# Patient Record
Sex: Female | Born: 1935 | Race: White | Hispanic: No
Health system: Southern US, Community
[De-identification: ages and names within clinical notes are randomized; demographics above are authoritative.]

---

## 1998-06-24 ENCOUNTER — Encounter: Payer: Self-pay | Admitting: Emergency Medicine

## 1998-06-24 ENCOUNTER — Observation Stay (HOSPITAL_COMMUNITY): Admission: EM | Admit: 1998-06-24 | Discharge: 1998-06-25 | Payer: Self-pay | Admitting: Emergency Medicine

## 1998-09-03 ENCOUNTER — Ambulatory Visit (HOSPITAL_COMMUNITY): Admission: RE | Admit: 1998-09-03 | Discharge: 1998-09-03 | Payer: Self-pay

## 2004-05-18 ENCOUNTER — Ambulatory Visit: Payer: Self-pay

## 2004-07-06 ENCOUNTER — Ambulatory Visit: Payer: Self-pay | Admitting: Ophthalmology

## 2004-07-13 ENCOUNTER — Ambulatory Visit: Payer: Self-pay | Admitting: Ophthalmology

## 2005-04-09 ENCOUNTER — Ambulatory Visit: Payer: Self-pay

## 2007-03-12 ENCOUNTER — Emergency Department: Payer: Self-pay | Admitting: Emergency Medicine

## 2007-03-12 ENCOUNTER — Other Ambulatory Visit: Payer: Self-pay

## 2007-08-17 ENCOUNTER — Ambulatory Visit: Payer: Self-pay

## 2008-03-01 ENCOUNTER — Inpatient Hospital Stay: Payer: Self-pay | Admitting: Specialist

## 2008-09-12 ENCOUNTER — Emergency Department: Payer: Self-pay | Admitting: Emergency Medicine

## 2009-02-10 ENCOUNTER — Inpatient Hospital Stay: Payer: Self-pay | Admitting: Internal Medicine

## 2009-07-14 ENCOUNTER — Ambulatory Visit: Payer: Self-pay

## 2011-06-07 ENCOUNTER — Emergency Department: Payer: Self-pay | Admitting: Emergency Medicine

## 2011-06-07 LAB — CBC
HGB: 11.2 g/dL — ABNORMAL LOW (ref 12.0–16.0)
MCHC: 33 g/dL (ref 32.0–36.0)
MCV: 98 fL (ref 80–100)
RBC: 3.48 10*6/uL — ABNORMAL LOW (ref 3.80–5.20)
RDW: 13.8 % (ref 11.5–14.5)
WBC: 6.2 10*3/uL (ref 3.6–11.0)

## 2011-06-07 LAB — COMPREHENSIVE METABOLIC PANEL
Albumin: 3.5 g/dL (ref 3.4–5.0)
Alkaline Phosphatase: 62 U/L (ref 50–136)
Anion Gap: 6 — ABNORMAL LOW (ref 7–16)
Bilirubin,Total: 0.2 mg/dL (ref 0.2–1.0)
Chloride: 106 mmol/L (ref 98–107)
Co2: 31 mmol/L (ref 21–32)
EGFR (African American): 55 — ABNORMAL LOW
Glucose: 120 mg/dL — ABNORMAL HIGH (ref 65–99)
Osmolality: 293 (ref 275–301)
SGOT(AST): 15 U/L (ref 15–37)
SGPT (ALT): 14 U/L
Sodium: 143 mmol/L (ref 136–145)

## 2011-08-03 LAB — CBC
HCT: 34.3 % — ABNORMAL LOW (ref 35.0–47.0)
HGB: 11.3 g/dL — ABNORMAL LOW (ref 12.0–16.0)
MCH: 32.3 pg (ref 26.0–34.0)
MCHC: 32.9 g/dL (ref 32.0–36.0)
MCV: 98 fL (ref 80–100)
Platelet: 179 10*3/uL (ref 150–440)
RBC: 3.5 10*6/uL — ABNORMAL LOW (ref 3.80–5.20)
RDW: 15 % — ABNORMAL HIGH (ref 11.5–14.5)
WBC: 7.4 10*3/uL (ref 3.6–11.0)

## 2011-08-03 LAB — COMPREHENSIVE METABOLIC PANEL
Albumin: 2.9 g/dL — ABNORMAL LOW (ref 3.4–5.0)
Alkaline Phosphatase: 72 U/L (ref 50–136)
Anion Gap: 7 (ref 7–16)
BUN: 40 mg/dL — ABNORMAL HIGH (ref 7–18)
Bilirubin,Total: 0.3 mg/dL (ref 0.2–1.0)
Calcium, Total: 9 mg/dL (ref 8.5–10.1)
Chloride: 104 mmol/L (ref 98–107)
Co2: 32 mmol/L (ref 21–32)
Creatinine: 1.58 mg/dL — ABNORMAL HIGH (ref 0.60–1.30)
EGFR (African American): 34 — ABNORMAL LOW
EGFR (Non-African Amer.): 30 — ABNORMAL LOW
Glucose: 203 mg/dL — ABNORMAL HIGH (ref 65–99)
Osmolality: 301 (ref 275–301)
Potassium: 4.7 mmol/L (ref 3.5–5.1)
SGOT(AST): 19 U/L (ref 15–37)
SGPT (ALT): 16 U/L
Sodium: 143 mmol/L (ref 136–145)
Total Protein: 7.5 g/dL (ref 6.4–8.2)

## 2011-08-04 ENCOUNTER — Inpatient Hospital Stay: Payer: Self-pay | Admitting: Internal Medicine

## 2011-08-05 LAB — BASIC METABOLIC PANEL
Anion Gap: 5 — ABNORMAL LOW (ref 7–16)
BUN: 26 mg/dL — ABNORMAL HIGH (ref 7–18)
Calcium, Total: 8.3 mg/dL — ABNORMAL LOW (ref 8.5–10.1)
Chloride: 107 mmol/L (ref 98–107)
Co2: 32 mmol/L (ref 21–32)
Creatinine: 1.23 mg/dL (ref 0.60–1.30)
EGFR (African American): 47 — ABNORMAL LOW
EGFR (Non-African Amer.): 40 — ABNORMAL LOW
Glucose: 74 mg/dL (ref 65–99)
Osmolality: 290 (ref 275–301)
Potassium: 4.1 mmol/L (ref 3.5–5.1)
Sodium: 144 mmol/L (ref 136–145)

## 2011-08-08 LAB — WOUND CULTURE

## 2011-08-09 LAB — CULTURE, BLOOD (SINGLE)

## 2011-08-11 ENCOUNTER — Ambulatory Visit: Payer: Self-pay | Admitting: Vascular Surgery

## 2011-08-16 ENCOUNTER — Inpatient Hospital Stay: Payer: Self-pay | Admitting: Internal Medicine

## 2011-08-16 LAB — COMPREHENSIVE METABOLIC PANEL
Alkaline Phosphatase: 64 U/L (ref 50–136)
Anion Gap: 9 (ref 7–16)
Bilirubin,Total: 0.3 mg/dL (ref 0.2–1.0)
Calcium, Total: 8.4 mg/dL — ABNORMAL LOW (ref 8.5–10.1)
Co2: 31 mmol/L (ref 21–32)
Creatinine: 1.31 mg/dL — ABNORMAL HIGH (ref 0.60–1.30)
EGFR (African American): 43 — ABNORMAL LOW
EGFR (Non-African Amer.): 37 — ABNORMAL LOW
Glucose: 200 mg/dL — ABNORMAL HIGH (ref 65–99)
Potassium: 4.4 mmol/L (ref 3.5–5.1)
SGOT(AST): 15 U/L (ref 15–37)
SGPT (ALT): 13 U/L
Sodium: 145 mmol/L (ref 136–145)

## 2011-08-16 LAB — CBC WITH DIFFERENTIAL/PLATELET
Basophil #: 0 10*3/uL (ref 0.0–0.1)
Basophil %: 0.3 %
Eosinophil %: 0.7 %
Lymphocyte #: 1.7 10*3/uL (ref 1.0–3.6)
MCHC: 31.6 g/dL — ABNORMAL LOW (ref 32.0–36.0)
MCV: 99 fL (ref 80–100)
Monocyte %: 16.9 %
Neutrophil %: 60.2 %
Platelet: 196 10*3/uL (ref 150–440)
RBC: 3.22 10*6/uL — ABNORMAL LOW (ref 3.80–5.20)
WBC: 7.9 10*3/uL (ref 3.6–11.0)

## 2011-08-16 LAB — PROTIME-INR
INR: 1
Prothrombin Time: 13.1 secs (ref 11.5–14.7)

## 2011-08-16 LAB — CREATININE, SERUM
Creatinine: 1.57 mg/dL — ABNORMAL HIGH (ref 0.60–1.30)
EGFR (African American): 35 — ABNORMAL LOW
EGFR (Non-African Amer.): 30 — ABNORMAL LOW

## 2011-08-16 LAB — BUN: BUN: 37 mg/dL — ABNORMAL HIGH (ref 7–18)

## 2011-08-17 LAB — COMPREHENSIVE METABOLIC PANEL
Albumin: 2.2 g/dL — ABNORMAL LOW (ref 3.4–5.0)
Alkaline Phosphatase: 63 U/L (ref 50–136)
Bilirubin,Total: 0.2 mg/dL (ref 0.2–1.0)
Calcium, Total: 7.9 mg/dL — ABNORMAL LOW (ref 8.5–10.1)
Chloride: 106 mmol/L (ref 98–107)
Co2: 32 mmol/L (ref 21–32)
Creatinine: 1.29 mg/dL (ref 0.60–1.30)
EGFR (African American): 44 — ABNORMAL LOW
EGFR (Non-African Amer.): 38 — ABNORMAL LOW
SGPT (ALT): 11 U/L — ABNORMAL LOW

## 2011-08-17 LAB — CBC WITH DIFFERENTIAL/PLATELET
Basophil #: 0 10*3/uL (ref 0.0–0.1)
Basophil %: 0.3 %
Eosinophil #: 0 10*3/uL (ref 0.0–0.7)
Eosinophil %: 0.6 %
HCT: 28.8 % — ABNORMAL LOW (ref 35.0–47.0)
HGB: 9.2 g/dL — ABNORMAL LOW (ref 12.0–16.0)
Lymphocyte #: 2 10*3/uL (ref 1.0–3.6)
MCH: 31.4 pg (ref 26.0–34.0)
MCHC: 32.1 g/dL (ref 32.0–36.0)
MCV: 98 fL (ref 80–100)
Monocyte #: 1.2 x10 3/mm — ABNORMAL HIGH (ref 0.2–0.9)
Monocyte %: 17.3 %
Neutrophil #: 3.7 10*3/uL (ref 1.4–6.5)
Platelet: 187 10*3/uL (ref 150–440)
RBC: 2.94 10*6/uL — ABNORMAL LOW (ref 3.80–5.20)
RDW: 15.1 % — ABNORMAL HIGH (ref 11.5–14.5)
WBC: 7.1 10*3/uL (ref 3.6–11.0)

## 2011-08-17 LAB — CK TOTAL AND CKMB (NOT AT ARMC)
CK, Total: 46 U/L (ref 21–215)
CK-MB: 2.7 ng/mL (ref 0.5–3.6)

## 2011-08-17 LAB — LIPID PANEL
Cholesterol: 142 mg/dL (ref 0–200)
HDL Cholesterol: 47 mg/dL (ref 40–60)
Ldl Cholesterol, Calc: 76 mg/dL (ref 0–100)
Triglycerides: 93 mg/dL (ref 0–200)

## 2011-08-17 LAB — APTT
Activated PTT: 134.6 secs — ABNORMAL HIGH (ref 23.6–35.9)
Activated PTT: 135.1 secs — ABNORMAL HIGH (ref 23.6–35.9)

## 2011-08-17 LAB — DIGOXIN LEVEL: Digoxin: 1.6 ng/mL

## 2011-08-17 LAB — TROPONIN I
Troponin-I: 0.29 ng/mL — ABNORMAL HIGH
Troponin-I: 0.28 ng/mL — ABNORMAL HIGH

## 2011-08-17 LAB — HEMOGLOBIN A1C: Hemoglobin A1C: 6.3 % (ref 4.2–6.3)

## 2011-08-17 LAB — MAGNESIUM: Magnesium: 1.7 mg/dL — ABNORMAL LOW

## 2012-03-26 ENCOUNTER — Emergency Department: Payer: Self-pay | Admitting: Emergency Medicine

## 2012-03-26 LAB — LIPASE, BLOOD: Lipase: 71 U/L — ABNORMAL LOW (ref 73–393)

## 2012-03-26 LAB — COMPREHENSIVE METABOLIC PANEL
Albumin: 2.8 g/dL — ABNORMAL LOW (ref 3.4–5.0)
Alkaline Phosphatase: 323 U/L — ABNORMAL HIGH (ref 50–136)
Anion Gap: 4 — ABNORMAL LOW (ref 7–16)
BUN: 26 mg/dL — ABNORMAL HIGH (ref 7–18)
Calcium, Total: 8.8 mg/dL (ref 8.5–10.1)
Chloride: 105 mmol/L (ref 98–107)
Creatinine: 1.03 mg/dL (ref 0.60–1.30)
EGFR (African American): 57 — ABNORMAL LOW
EGFR (Non-African Amer.): 50 — ABNORMAL LOW
Glucose: 265 mg/dL — ABNORMAL HIGH (ref 65–99)
Osmolality: 293 (ref 275–301)
Potassium: 3.7 mmol/L (ref 3.5–5.1)
SGOT(AST): 55 U/L — ABNORMAL HIGH (ref 15–37)
Sodium: 140 mmol/L (ref 136–145)
Total Protein: 7 g/dL (ref 6.4–8.2)

## 2012-03-26 LAB — CBC WITH DIFFERENTIAL/PLATELET
Basophil #: 0 10*3/uL (ref 0.0–0.1)
Basophil %: 0.2 %
Lymphocyte #: 2 10*3/uL (ref 1.0–3.6)
MCH: 31.1 pg (ref 26.0–34.0)
MCHC: 32.8 g/dL (ref 32.0–36.0)
MCV: 95 fL (ref 80–100)
Monocyte #: 0.8 x10 3/mm (ref 0.2–0.9)
Monocyte %: 12.2 %
Neutrophil #: 3.7 10*3/uL (ref 1.4–6.5)
Neutrophil %: 56.6 %
RBC: 3.8 10*6/uL (ref 3.80–5.20)
WBC: 6.6 10*3/uL (ref 3.6–11.0)

## 2012-03-26 LAB — URINALYSIS, COMPLETE
Bacteria: NONE SEEN
Bilirubin,UR: NEGATIVE
Blood: NEGATIVE
Leukocyte Esterase: NEGATIVE
Ph: 5 (ref 4.5–8.0)
RBC,UR: 8 /HPF (ref 0–5)
Specific Gravity: 1.03 (ref 1.003–1.030)
WBC UR: 2 /HPF (ref 0–5)

## 2012-03-26 LAB — DIGOXIN LEVEL: Digoxin: 0.1 ng/mL

## 2012-07-20 ENCOUNTER — Emergency Department: Payer: Self-pay | Admitting: Unknown Physician Specialty

## 2012-07-20 LAB — COMPREHENSIVE METABOLIC PANEL
Albumin: 3.4 g/dL (ref 3.4–5.0)
Alkaline Phosphatase: 75 U/L (ref 50–136)
Anion Gap: 3 — ABNORMAL LOW (ref 7–16)
BUN: 12 mg/dL (ref 7–18)
Chloride: 107 mmol/L (ref 98–107)
Creatinine: 0.94 mg/dL (ref 0.60–1.30)
EGFR (African American): >60
EGFR (Non-African Amer.): 55 — ABNORMAL LOW
Glucose: 183 mg/dL — ABNORMAL HIGH (ref 65–99)
Potassium: 4 mmol/L (ref 3.5–5.1)
SGOT(AST): 18 U/L (ref 15–37)
Total Protein: 6.8 g/dL (ref 6.4–8.2)

## 2012-07-20 LAB — TROPONIN I: Troponin-I: <0.02 ng/mL

## 2012-07-20 LAB — CBC
HGB: 13.2 g/dL (ref 12.0–16.0)
Platelet: 151 10*3/uL (ref 150–440)
RBC: 4.19 10*6/uL (ref 3.80–5.20)
RDW: 14.9 % — ABNORMAL HIGH (ref 11.5–14.5)
WBC: 7.1 10*3/uL (ref 3.6–11.0)

## 2012-07-20 LAB — URINALYSIS, COMPLETE
Glucose,UR: NEGATIVE mg/dL (ref 0–75)
Leukocyte Esterase: NEGATIVE
Ph: 5 (ref 4.5–8.0)
Protein: NEGATIVE
RBC,UR: 90 /HPF (ref 0–5)
Specific Gravity: 1.021 (ref 1.003–1.030)
Squamous Epithelial: <1
WBC UR: 2 /HPF (ref 0–5)

## 2012-07-20 LAB — CK TOTAL AND CKMB (NOT AT ARMC): CK-MB: 0.5 ng/mL (ref 0.5–3.6)

## 2012-08-16 ENCOUNTER — Emergency Department: Payer: Self-pay | Admitting: Emergency Medicine

## 2012-08-16 LAB — COMPREHENSIVE METABOLIC PANEL
Albumin: 3.6 g/dL (ref 3.4–5.0)
Alkaline Phosphatase: 92 U/L (ref 50–136)
Anion Gap: 6 — ABNORMAL LOW (ref 7–16)
Calcium, Total: 9.3 mg/dL (ref 8.5–10.1)
Chloride: 101 mmol/L (ref 98–107)
EGFR (African American): >60
EGFR (Non-African Amer.): >60
Osmolality: 283 (ref 275–301)
SGPT (ALT): 13 U/L (ref 12–78)
Sodium: 139 mmol/L (ref 136–145)
Total Protein: 7.6 g/dL (ref 6.4–8.2)

## 2012-08-16 LAB — URINALYSIS, COMPLETE
Blood: NEGATIVE
Ph: 6 (ref 4.5–8.0)
RBC,UR: 7 /HPF (ref 0–5)
Squamous Epithelial: NONE SEEN
WBC UR: <1 /HPF (ref 0–5)

## 2012-08-16 LAB — CK TOTAL AND CKMB (NOT AT ARMC): CK, Total: 47 U/L (ref 21–215)

## 2012-08-16 LAB — TROPONIN I
Troponin-I: <0.02 ng/mL
Troponin-I: <0.02 ng/mL

## 2012-08-16 LAB — CBC
HCT: 39.9 % (ref 35.0–47.0)
MCH: 31.6 pg (ref 26.0–34.0)
MCHC: 33.5 g/dL (ref 32.0–36.0)
MCV: 95 fL (ref 80–100)
Platelet: 139 10*3/uL — ABNORMAL LOW (ref 150–440)
RBC: 4.22 10*6/uL (ref 3.80–5.20)
RDW: 14.5 % (ref 11.5–14.5)
WBC: 7.8 10*3/uL (ref 3.6–11.0)

## 2012-08-16 LAB — LIPASE, BLOOD: Lipase: 72 U/L — ABNORMAL LOW (ref 73–393)

## 2012-09-06 ENCOUNTER — Ambulatory Visit: Payer: Self-pay | Admitting: Surgery

## 2012-09-18 ENCOUNTER — Ambulatory Visit: Payer: Self-pay | Admitting: Surgery

## 2012-09-20 ENCOUNTER — Ambulatory Visit: Payer: Self-pay | Admitting: Surgery

## 2012-09-21 LAB — PATHOLOGY REPORT

## 2012-09-22 ENCOUNTER — Inpatient Hospital Stay: Payer: Self-pay | Admitting: Internal Medicine

## 2012-09-22 LAB — LIPASE, BLOOD: Lipase: 66 U/L — ABNORMAL LOW (ref 73–393)

## 2012-09-22 LAB — CBC WITH DIFFERENTIAL/PLATELET
Basophil #: 0 10*3/uL (ref 0.0–0.1)
Basophil %: 0.3 %
Eosinophil %: 0.1 %
HCT: 37.6 % (ref 35.0–47.0)
HGB: 12.4 g/dL (ref 12.0–16.0)
Lymphocyte #: 1.3 10*3/uL (ref 1.0–3.6)
Monocyte #: 1.4 x10 3/mm — ABNORMAL HIGH (ref 0.2–0.9)
Monocyte %: 12.4 %
Neutrophil #: 8.8 10*3/uL — ABNORMAL HIGH (ref 1.4–6.5)
Neutrophil %: 75.6 %

## 2012-09-22 LAB — URINALYSIS, COMPLETE
Bilirubin,UR: NEGATIVE
Glucose,UR: NEGATIVE mg/dL (ref 0–75)
Nitrite: NEGATIVE
Ph: 5 (ref 4.5–8.0)
Protein: 30
RBC,UR: <1 /HPF (ref 0–5)
Specific Gravity: 1.028 (ref 1.003–1.030)
Squamous Epithelial: NONE SEEN
WBC UR: 1 /HPF (ref 0–5)

## 2012-09-22 LAB — COMPREHENSIVE METABOLIC PANEL
Albumin: 2.5 g/dL — ABNORMAL LOW (ref 3.4–5.0)
Alkaline Phosphatase: 72 U/L (ref 50–136)
Bilirubin,Total: 0.8 mg/dL (ref 0.2–1.0)
Chloride: 104 mmol/L (ref 98–107)
Creatinine: 0.96 mg/dL (ref 0.60–1.30)
EGFR (African American): >60
EGFR (Non-African Amer.): 54 — ABNORMAL LOW
Glucose: 172 mg/dL — ABNORMAL HIGH (ref 65–99)
Osmolality: 289 (ref 275–301)
Potassium: 3.8 mmol/L (ref 3.5–5.1)
SGOT(AST): 19 U/L (ref 15–37)
Total Protein: 6.3 g/dL — ABNORMAL LOW (ref 6.4–8.2)

## 2012-09-22 LAB — TSH: Thyroid Stimulating Horm: 0.811 u[IU]/mL

## 2012-09-23 LAB — BASIC METABOLIC PANEL
Anion Gap: 5 — ABNORMAL LOW (ref 7–16)
BUN: 27 mg/dL — ABNORMAL HIGH (ref 7–18)
Calcium, Total: 8.9 mg/dL (ref 8.5–10.1)
Chloride: 106 mmol/L (ref 98–107)
Co2: 28 mmol/L (ref 21–32)
EGFR (African American): >60
EGFR (Non-African Amer.): >60
Osmolality: 289 (ref 275–301)
Potassium: 3.6 mmol/L (ref 3.5–5.1)
Sodium: 139 mmol/L (ref 136–145)

## 2012-09-23 LAB — CBC WITH DIFFERENTIAL/PLATELET
Basophil #: 0 10*3/uL (ref 0.0–0.1)
Basophil %: 0.1 %
Eosinophil #: 0 10*3/uL (ref 0.0–0.7)
HCT: 33.2 % — ABNORMAL LOW (ref 35.0–47.0)
HGB: 11.2 g/dL — ABNORMAL LOW (ref 12.0–16.0)
Lymphocyte #: 1 10*3/uL (ref 1.0–3.6)
Lymphocyte %: 10.6 %
MCH: 31.8 pg (ref 26.0–34.0)
Monocyte #: 0.9 x10 3/mm (ref 0.2–0.9)
Monocyte %: 10.5 %
Platelet: 103 10*3/uL — ABNORMAL LOW (ref 150–440)
RBC: 3.53 10*6/uL — ABNORMAL LOW (ref 3.80–5.20)
RDW: 14.8 % — ABNORMAL HIGH (ref 11.5–14.5)
WBC: 9 10*3/uL (ref 3.6–11.0)

## 2012-09-23 LAB — URINE CULTURE

## 2012-09-24 LAB — BASIC METABOLIC PANEL
Calcium, Total: 9 mg/dL (ref 8.5–10.1)
Chloride: 106 mmol/L (ref 98–107)
Co2: 26 mmol/L (ref 21–32)
Creatinine: 0.75 mg/dL (ref 0.60–1.30)
EGFR (African American): >60
Glucose: 182 mg/dL — ABNORMAL HIGH (ref 65–99)
Osmolality: 289 (ref 275–301)
Potassium: 3.1 mmol/L — ABNORMAL LOW (ref 3.5–5.1)
Sodium: 141 mmol/L (ref 136–145)

## 2012-09-25 LAB — CBC WITH DIFFERENTIAL/PLATELET
Basophil #: 0 10*3/uL (ref 0.0–0.1)
Eosinophil #: 0 10*3/uL (ref 0.0–0.7)
Eosinophil %: 0.4 %
HCT: 31.9 % — ABNORMAL LOW (ref 35.0–47.0)
Lymphocyte #: 1.1 10*3/uL (ref 1.0–3.6)
MCHC: 34 g/dL (ref 32.0–36.0)
MCV: 94 fL (ref 80–100)
Monocyte #: 0.9 x10 3/mm (ref 0.2–0.9)
Neutrophil #: 4.6 10*3/uL (ref 1.4–6.5)
Neutrophil %: 68.4 %
Platelet: 139 10*3/uL — ABNORMAL LOW (ref 150–440)
RDW: 14.8 % — ABNORMAL HIGH (ref 11.5–14.5)
WBC: 6.7 10*3/uL (ref 3.6–11.0)

## 2012-09-25 LAB — BASIC METABOLIC PANEL
Calcium, Total: 8.7 mg/dL (ref 8.5–10.1)
Chloride: 108 mmol/L — ABNORMAL HIGH (ref 98–107)
Co2: 28 mmol/L (ref 21–32)
EGFR (Non-African Amer.): >60
Potassium: 3.2 mmol/L — ABNORMAL LOW (ref 3.5–5.1)
Sodium: 141 mmol/L (ref 136–145)

## 2012-09-25 LAB — CLOSTRIDIUM DIFFICILE BY PCR

## 2012-09-26 LAB — BASIC METABOLIC PANEL
Anion Gap: 6 — ABNORMAL LOW (ref 7–16)
Chloride: 105 mmol/L (ref 98–107)
Co2: 29 mmol/L (ref 21–32)
Creatinine: 0.59 mg/dL — ABNORMAL LOW (ref 0.60–1.30)
EGFR (Non-African Amer.): >60
Glucose: 158 mg/dL — ABNORMAL HIGH (ref 65–99)
Osmolality: 281 (ref 275–301)
Sodium: 140 mmol/L (ref 136–145)

## 2012-09-27 LAB — CULTURE, BLOOD (SINGLE)

## 2012-12-02 DEATH — deceased

## 2014-05-21 NOTE — H&P (Signed)
PATIENT NAME:  Kristi Harris, FEGGINS MR#:  [id number] DATE OF BIRTH:  March 18, 1924  DATE OF ADMISSION:  08/16/2011  REFERRING PHYSICIAN: Dr. Wyn Quaker    PRIMARY CARE PHYSICIAN: Dr. Florentina Jenny   CHIEF COMPLAINT: New onset shortness of breath with new onset atrial fibrillation.   HISTORY OF PRESENT ILLNESS: The patient is an 81 year old female with past medical history of hypertension, diabetes, hyperlipidemia, seizure activity who is presently at [facility]. She was admitted as an outpatient for angiography of her left leg for chronic arterial insufficiency and nonhealing ulcers. A week ago she had angiography with angioplasty done in her right leg. Prior to that she was admitted to [facility] 07/03 to 07/05 for chronic lower extremity  ulcers due to diabetes, arterial insufficiency, and poor foot wear. When the patient arrived at [facility], she was in normal sinus rhythm with heart rate in the 70's. When the patient was about to be taken for the procedure, she complained of some shortness of breath and was noted to have new onset atrial fibrillation with heart rate in the 150's. She was also hypotensive with systolic blood pressure in the 70s. She was given a fluid bolus and her blood pressure slightly improved. She also got 20 units of IV Cardizem and she reports that she feels better.   PAST MEDICAL HISTORY:  1. Hypertension.  2. Diabetes.  3. Hyperlipidemia.  4. Seizure activity. 5. Chronic kidney disease. 6. Anemia of chronic kidney disease. 7. Chronic lower extremity ulcers due to diabetes, arterial insufficiency, and poor foot wear. The patient was last admitted to [facility] for these complaints. She was evaluated by ID who did not feel that the patient required any systemic antibiotics because there was no evidence of infection. Subsequently, the patient has undergone right leg arterial arteriogram with angioplasty. She was scheduled to have left lower extremity angiogram with possible  intervention today before she went into atrial fibrillation.   PAST SURGICAL HISTORY: Knee replacement.   FAMILY HISTORY: Sister had breast cancer. Brother had lung cancer.   SOCIAL HISTORY: There is no history of smoking, alcohol, or drug abuse. The patient is a resident of [facility] [address]. She is a widow.   CODE STATUS: DO NOT RESUSCITATE. This was confirmed with the patient. She also has an out-of-facility DO NOT RESUSCITATE sheet.  REVIEW OF SYSTEMS: CONSTITUTIONAL: Denies any fever, fatigue, weakness. EYES: Denies any blurred or double vision. ENT: Denies any tinnitus or ear pain. CARDIOVASCULAR: Reported some shortness of breath earlier. Denies any chest pain. RESPIRATORY: Denies any cough, wheezing. GI: Denies any nausea, vomiting, abdominal pain. GU: Denies any dysuria or hematuria. ENDOCRINE: Denies any polyuria or nocturia. MUSCULOSKELETAL: Denies any swelling, gout. INTEGUMENTARY: Has chronic lower extremity ulcers. PSYCH: Denies anxiety or depression. NEUROLOGICAL: Denies any weakness or tremors.   PHYSICAL EXAMINATION:   VITAL SIGNS: Temperature 98.6, heart rate is ranging from 110 to 150, blood pressure initially was 70 with pulse oximetry in the 80's.   GENERAL: The patient is an elderly chronically ill appearing female laying comfortably in bed not in acute distress.   HEAD: Atraumatic, normocephalic.   EYES: There is some pallor. No icterus or cyanosis. Pupils equal, round, and reactive to light and accommodation. Extraocular movements intact.    ENT: Dry mucous membranes. No oropharyngeal erythema or thrush.   NECK: Supple. No masses. No JVD. No thyromegaly or lymphadenopathy.   CHEST WALL: No tenderness to palpation. Not using accessory muscles of respiration. No intercostal muscle retractions.   LUNGS:  Bilateral basal crepitations. No wheezing or rhonchi.   CARDIOVASCULAR: S1, S2 irregularly irregular, tachycardic. There is a systolic murmur. No rubs or gallops.    ABDOMEN: Soft, nontender, nondistended. No guarding or rigidity. No organomegaly.   SKIN: The patient has chronic lower extremity ulcers with chronic venostasis changes.   PERIPHERIES: There is some pedal edema, poorly palpable pulses.   MUSCULOSKELETAL: No cyanosis or clubbing.   NEUROLOGICAL: The patient is awake, alert, oriented x3. Nonfocal neurological exam. Cranial nerves grossly intact.   PSYCH: Normal mood and affect.  LABORATORY, DIAGNOSTIC, AND RADIOLOGICAL DATA: 12-lead EKG shows that she is in atrial fibrillation with RVR. Labs have just been ordered and not back as yet.   ASSESSMENT AND PLAN: This is an 81 year old female with past medical history of hypertension, diabetes, and hyperlipidemia who is currently in [facility] for left lower extremity angiogram who developed new onset atrial fibrillation. 1. New onset atrial fibrillation. The patient is hypotensive and hypoxic. She has gotten one dose of IV Cardizem 20 mg, one dose. Will give her one dose of digoxin 0.5 mg once since she is hypotensive. She will be admitted to the ICU and started on a Cardizem drip since her blood pressure is improved with fluids. Will continue to resuscitate with fluids. I have spoken to Dr. Lady GaryFath who will evaluate the patient. He agrees with giving the patient digoxin. Will obtain a 2-D echo. Will also start the patient on heparin drip. Will check a TSH. 2. Hypoxia. This is new onset. The patient does not appear to be in fluid overload/wet at present. She has not been very mobile given her lower extremity ulcers. Will obtain lower extremity Doppler's and V/Q scan given her renal insufficiency to rule out any PE. In the meantime, she will be on a heparin drip.  3. Hypotension. This is possibly related to hemodynamic compromise resulting from atrial fibrillation. Will resuscitate with IV fluids. Start her on a Cardizem drip and give her digoxin. Hopefully that will help control her heart rate  better and help improve perfusion.  4. Diabetes. Will place on an ADA diet, insulin sliding scale. Continue her glipizide and Januvia.  5. History of hypertension. Currently the patient is hypotensive likely due to atrial fibrillation with RVR. Will start her on a Cardizem drip and hold her other antihypertensive medications. Will also load her with digoxin and start on digoxin in a.m.   6. History of hyperlipidemia. Will continue statin therapy.  7. History of seizure disorder. Will continue Depakote.  8. History of chronic kidney disease. Will monitor her creatinine closely. Will avoid nephrotoxic medications and will monitor strict I's and O's. 9. History of anemia of chronic disease, chronic kidney disease. Will continue her oral iron supplementation.  10. Mood disorder. Will continue p.r.n. Ativan and Risperdal. Will continue Cymbalta.  11. History of gastroesophageal reflux disease. Will continue omeprazole.   Reviewed old medical records, discussed with the patient, discussed with Dr. Wyn Quakerew, discussed with Dr. Lady GaryFath.  CRITICAL CARE TIME SPENT: 50 minutes.   ____________________________ Darrick MeigsSangeeta Alawna Graybeal, MD sp:drc D: 08/16/2011 14:18:12 ET T: 08/16/2011 14:44:27 ET JOB#: [id number]  cc: Darrick MeigsSangeeta Mohmed Farver, MD, <Dictator> Drue DunHenry F. Redmond Schoolripp, MD Darrick MeigsSANGEETA Emerson Barretto MD ELECTRONICALLY SIGNED 08/16/2011 17:08

## 2014-05-21 NOTE — Consult Note (Signed)
General Aspect patient is an 81 year old female with history of diabetes, peripheral vascular disease, chronic kidney disease as well as seizure disorder. [facility] for an angiogram of her lower extremity to evaluate etiology of failure to heal lower extremity ulcerations. On arrival to the angiography suite, she was in sinus rhythm. She subsequently developed atrial fibrillation with rapid ventricular response with hypotension. She complained of shortness of breath and initially was felt to have hypoxia with a pulse oximetry of 76%. Adjustment of the pulse oximetry device improved her oxygenation. She was given IV Cardizem and IV digoxin with improvement in her heart rate. She denied chest pain. She has been somewhat sedentary do to her lower extremity symptoms. She denies any chest pain with this. Patient also has renal insufficiency with a creatinine of 1.31. Her GFR is 30. After slight improvement in her heart rate her blood pressure has improved. Patient is transferred to the ICU and placed on IV Cardizem drip.    Present Illness As per general aspect   Physical Exam:   GEN well nourished, disheveled    HEENT dry oral mucosa, poor dentition    RESP normal resp effort  crackles    CARD Irregular rate and rhythm  Tachycardic  Murmur    Murmur Systolic    Systolic Murmur axilla    ABD denies tenderness  normal BS    LYMPH negative neck    EXTR positive edema, bilateral lower extremity ecchymosis with ulceration on her right lower extremity    SKIN positive rashes, positive ulcers    NEURO cranial nerves intact, motor/sensory function intact    PSYCH A+O to time, place, person   Review of Systems:   Subjective/Chief Complaint shortness of breath    General: No Complaints    Skin: Rashes  Dryness  Color changes    ENT: No Complaints    Eyes: No Complaints    Neck: No Complaints    Respiratory: Short of breath    Cardiovascular:  Palpitations  Dyspnea    Gastrointestinal: No Complaints    Genitourinary: No Complaints    Vascular: Calf pain with walking  Leg cramping  ulceration her lower extremities    Musculoskeletal: No Complaints    Neurologic: No Complaints    Hematologic: No Complaints    Endocrine: No Complaints    Psychiatric: No Complaints    Review of Systems: All other systems were reviewed and found to be negative    Medications/Allergies Reviewed Medications/Allergies reviewed     Fall History:    arthritis:    seizures:    diabetes:    hypertension:    tia:    surgery to right great toe:   Home Medications: Medication Instructions Status  hydrochlorothiazide-lisinopril 12.5 mg-10 mg oral tablet 1 tab(s) orally once a day (8 am) Active  PreserVision oral capsule 1 cap(s) orally once a day (8 am) Active  loratadine 10 mg oral tablet 1 tab(s) orally once a day (8 am) Active  divalproex sodium 250 mg oral delayed release tablet 1 tab(s) orally 2 times a day along with 500 mg tablet (8 am,8 pm) Active  divalproex sodium 500 mg oral delayed release tablet 1 tab(s) orally 2 times a day along with 250 mg tablet (8 am, 8 pm) Active  gabapentin 100 mg oral capsule 1 cap(s) orally 2 times a day (8 am, 8 pm) Active  glipiZIDE 10 mg oral tablet 1 tab(s) orally 2 times a day (8  am, 8 pm) Active  omeprazole 20 mg oral delayed release capsule 1 cap(s) orally 2 times a day (8 am, 8 pm) Active  ferrous sulfate 325 mg (65 mg elemental iron) oral tablet 1 tab(s) orally once a day (8 am) Active  Cymbalta 60 mg oral delayed release capsule 1 cap(s) orally once a day (8 am) Active  multivitamin 1 tab(s) orally once a day (8 am) Active  risperidone 0.25 mg oral tablet 1 tab(s) orally once a day (at bedtime) (8 pm) Active  alprazolam 0.5 mg oral tablet 1 tab(s) orally once a day (at bedtime) (8 pm) Active  simvastatin 20 mg oral tablet 1 tab(s) orally once a day (at bedtime) (8 pm) Active  trazodone  50 mg oral tablet 1 tab(s) orally once a day (at bedtime) (8 pm) Active  alprazolam 0.25 mg oral tablet 1 tab(s) orally every 6 hours, As Needed- for Anxiety, Nervousness, agitation Active  hydrocodone 1 cap(s) orally every 6 hours, As Needed- for Pain  Active   EKG:   Interpretation atrial fibrillation with rapid ventricular response    No Known Allergies:     Impression 81 year old female with history of hypertension, hyperlipidemia, chronic kidney disease as well as seizure disorder who developed atrial fibrillation while preparing to undergo a lower extremity arteriogram. She has no known prior atrial fibrillation does complain of palpitations in the past. She has never been given the diagnosis of atrial fibrillation. She was initially hypotensive but improved with rate control and hydration. She was given IV Cardizem and IV digoxin and transferred to intensive care unit IV Cardizem drip. She currently has converted to normal sinus rhythm and remains hemodynamically stable. Etiology of the atrial fibrillation is likely multifactorial to include increased stress in preparation for a vascular study. We'll need to assess the patient's left ventricular function as well as atrial size. We'll continue to treat with rate control and IV heparin. Would wean off of IV Cardizem to p.o. Cardizem and continue with digoxin p.o. at 0.125 mg daily secondary to her renal dysfunction would rule out for myocardial infarction although the patient did not appear ischemic clinically. She may have mild cardiac marker leak secondary to increased myocardial demand with a rapid ventricular response.    Plan 1. Would wean off of IV Cardizem to p.o. Cardizem at 30 mg p.o. q.i.d. 2. Would convert to p.o. digoxin at 0.125 mg daily 3. Continue heparin overnight and consider chronic anticoagulation as the patient's CHADSS score is 2-3. 4. Should she remain stable in sinus rhythm overnight, would consider proceeding with  vascular study as planned 5. Avoid stimulants as outpatient 6. Further recommendations depending on the patient's course   Electronic Signatures: Dalia HeadingFath, Jaelene Garciagarcia A (MD)  (Signed 15-Jul-13 20:42)  Authored: General Aspect/Present Illness, History and Physical Exam, Review of System, Past Medical History, Home Medications, EKG , Allergies, Impression/Plan   Last Updated: 15-Jul-13 20:42 by Dalia HeadingFath, Mylan Lengyel A (MD)

## 2014-05-21 NOTE — Discharge Summary (Signed)
PATIENT NAME:  Kristi Harris, Kristi Harris MR#:  [id number] DATE OF BIRTH:  18-Jul-1924  DATE OF ADMISSION:  08/16/2011 DATE OF DISCHARGE:  08/18/2011  PRESENTING COMPLAINT: Rapid atrial fibrillation.   DISCHARGE DIAGNOSES:  1. Rapid atrial fibrillation, new onset, resolved.  2. Hypothyroidism, which is new.  3. Bilateral lower extremity peripheral arterial disease with foot ulcers.  4. Hypertension.  5. Type 2 diabetes.  6. Chronic kidney disease, stage 3.   CONDITION ON DISCHARGE: Fair.   PROCEDURES: None.   CODE STATUS: NO CODE, DO NOT RESUSCITATE.   DISCHARGE MEDICATIONS: 1. PreserVision 1 capsule daily.  2. Loratadine 10 mg daily.  3. Gabapentin 100 mg twice a day.  4. Glipizide 10 mg 1 tablet b.i.d.  5. Omeprazole 20 mg 1 capsule b.i.d.  6. Ferrous sulfate 325 mg 1 p.o. daily. 7. Cymbalta 60 mg 1 p.o. daily.  8. Multivitamin 1 p.o. daily.  9. Risperidone 0.25 mg 1 p.o. daily at bedtime.  10. Simvastatin 20 mg at bedtime.  11. Trazodone 50 mg at bedtime.  12. Divalproex 125 mg delayed release four capsules 3 times a day.  13. Acetaminophen/hydrocodone 325/5, one tablet every six hours p.r.n.  14. Januvia 50 mg p.o. daily.  15. Lorazepam 0.5 mg every six hours as needed.  16. Digoxin 0.125 mg p.o. daily.  17. Aspirin 81 mg daily.  18. Diltiazem-CD 120 mg 1 capsule once a day at bedtime.  19. Mag-Ox 400 mg p.o. daily.  20. Levothyroxine 25 mcg p.o. daily.  DISPOSITION:  The patient is being discharged to [facility].   FOLLOWUP:  1. Follow-up with Dr. Wyn Quaker as outpatient in 1 to 2 weeks.  2. Follow-up with Dr. Florentina Jenny, your primary care physician, in 1 to 2 weeks.   LABS AT DISCHARGE: CK total, MB x3 were negative. Troponin was 0.28. CBC within normal limits except hemoglobin and hematocrit of 9.2 and 28.8. Comprehensive metabolic panel within normal limits except calcium of 7.9 and glucose of 192, albumin 2.2. Magnesium is 1.7. Hemoglobin A1c is 6.3. Lipid profile  within normal limits. Digoxin was 1.60. Ultrasound Doppler lower extremity negative for deep vein thrombosis. Echo Doppler showed ejection fraction of 55%. Mild mitral regurgitation, mild concentric left ventricular hypertrophy. Left atrium is mildly dilated. There is moderate tricuspid regurgitation. Mitral valve leaflets appeared thickened but open well. Serum magnesium is 2.0. TSH 5.57.   CONSULTATIONS: 1. Cardiology consultation with Dr. Lady Gary.  2. Wound consultation with wound care team.  3. Vascular consultation with Dr. Wyn Quaker.   BRIEF SUMMARY OF HOSPITAL COURSE:  1. Ms. Liera is an 81 year old Caucasian female with multiple medical problems with past medical history of hypertension, diabetes, and hyperlipidemia who was admitted after she went into rapid atrial fibrillation while [facility] was [facility] her left lower extremity angiogram. She was admitted in the Intensive Care Unit, was started on p.o. digoxin and p.o. Cardizem. The patient's atrial fib resolved. She was in sinus rhythm prior to discharge. Her magnesium was replaced. She was initially started on IV heparin as part of anticoagulation given her elevated CHADS score. The patient was seen by Dr. Lady Gary. Dr. Lady Gary had recommended to continue aspirin at present and follow-up as an outpatient.  2. Hypoxia, new onset, resolved, likely in the setting of rapid atrial fibrillation. The patient does not appear to be fluid overload or not have any symptoms of congestive heart failure. Her sats were 93 to 94% on room air. V/Q scan was negative. Ultrasound of her  lower extremity was negative for deep vein thrombosis.  3. Hypotension, probably related to hemodynamic compromise resulting from atrial fibrillation, resolved.  4. Type 2 diabetes. The patient remained on her glipizide and Januvia along with sliding scale.  5. Bilateral peripheral arterial disease. The patient was seen by Dr. Wyn Quakerew. The patient was adamant in not getting the  procedure. Left lower extremity angiogram done. Hence, she will follow up with Dr. Wyn Quakerew as outpatient.  6. Seizure disorder, on Depakote.  7. Hypothyroidism, new onset, started on low dose Synthroid.  8. Chronic kidney disease. Creatinine was 1.57 at admission, resolved back to normal at 1.2 prior to discharge.  9. Mood disorder. P.r.n. Ativan, risperidone was ordered and the patient was continued on Cymbalta.   She is discharged back to [facility]. Discharge plan was discussed with the patient's daughter. The patient remained a NO CODE, DO NOT RESUSCITATE.   TIME SPENT: 40 minutes.  ____________________________ Wylie HailSona A. Allena KatzPatel, MD sap:ap D: 08/19/2011 07:30:45 ET T: 08/19/2011 14:58:48 ET JOB#: [id number]  cc: Homero Hyson A. Allena KatzPatel, MD, <Dictator> Darlin PriestlyKenneth A. Lady GaryFath, MD Annice NeedyJason S. Dew, MD Drue DunHenry F. Redmond Schoolripp, MD Willow OraSONA A Elmer Merwin MD ELECTRONICALLY SIGNED 08/30/2011 13:14

## 2014-05-24 NOTE — Discharge Summary (Signed)
PATIENT NAME:  Kristi Harris, Kristi Harris MR#:  [id number] DATE OF BIRTH:  09-Mar-1924  DISCHARGE DIAGNOSES:  1.  Altered mental status due to metabolic encephalopathy due to pneumonia.  2.  Pneumonia, likely due to aspiration.  3.  Diarrhea secondary to antibiotics, Clostridium difficile negative.  4.  Hypertension.  5.  Diabetes.   CONDITION ON DISCHARGE: Stable.   CODE STATUS. DO NOT RESUSCITATE.   MEDICATIONS ON DISCHARGE:  1.  Carvedilol 3.125 mg oral tablet 2 times a day.  2.  Omeprazole 20 mg two times a day.  3.  Atorvastatin 10 mg once a day.  4.  Risperidone 1 mg oral tablet once a day at bedtime for psychosis as needed.  5.  Ferrous sulfate 325 mg oral tablet once a day.  6.  Levothyroxine 25 mcg once a day.  7.  Multivitamin once a day.  8.  Januvia 50 mg oral tablet once a day.  9.  Loperamide 2 mg oral capsule 4 times a day for three days as needed for diarrhea.  10.  Amlodipine 5 mg oral tablet once a day.  11.  Amoxicillin clavulanate 875 plus 125 mg oral tablet every 12 hours for seven days.   DIET ON DISCHARGE: Low sodium. Diet consistency: Mechanical. Soft nectar-thick liquids. Aspiration precautions.   ACTIVITY LIMITATION: As tolerated.   TIMEFRAME TO FOLLOW-UP: Within 2 to 4 weeks. Routine follow-ups with PMD.   HISTORY OF PRESENT ILLNESS: The patient is an 81 year old female patient brought from [place] because of decreased urine output and altered mental status. She had gallbladder surgery on August 20 and she was sent to rehab after that, sent back to hospital because of altered mental status and decreased urine output.   The patient was found lethargic more than her baseline on August 22, and in the ER, her chest x-ray showed right lower lobe pneumonia and she was admitted due to lethargy, altered mental status, and aspiration pneumonia possibility.   HOSPITAL COURSE AND STAY: She was started on broad-spectrum antibiotic coverage for her pneumonia and as she was  recently having surgery and hospitalization and then sent to rehab. She had significant improvement after IV fluid and antibiotics within two days, but then she complained having severe diarrhea. We sent her stool for C. diff which was negative and we assume that it is due to antibiotics, and her antibiotics as her blood cultures were negative and the patient was having significant improvement, changed to Augmentin orally, and the patient continued to improve significantly.   Initially she was requiring oxygen supplementation with 2 liters nasal cannula, but later on after 3 to 4 days of treatment, her oxygen requirement went down and she was comfortable on room air.   OTHER MEDICAL ISSUES IN THIS HOSPITAL COURSE:  1.  Diabetes. We continued sliding-scale coverage and blood sugar level remained between 150 to 100.  2.  Hypokalemia. We replaced orally.  3.  Hypertension. We continued amlodipine and carvedilol.   IMPORTANT LABORATORY RESULTS IN THE HOSPITAL: Urinalysis was grossly negative. Chest x-ray, portable, showed atelectasis versus infiltrate, right lung base. WBC count on admission was 11,600. Hemoglobin was 12.4. Creatinine on admission was 0.96. Troponin was less than 0.02. Blood cultures were negative. CT head without contrast for altered mental status was done. No evidence of acute ischemia or hemorrhagia. No intracranial mass. Chronic age-related changes. Creatinine remained stable 0.75 and 0.62. On further follow-up, stool for C. diff was negative.   TOTAL TIME SPENT ON THIS DISCHARGE:  45 minutes.     ____________________________ Hope PigeonVaibhavkumar G. Elisabeth PigeonVachhani, MD vgv:np D: 09/26/2012 15:15:50 ET T: 09/26/2012 15:29:47 ET JOB#: [id number]  cc: Hope PigeonVaibhavkumar G. Elisabeth PigeonVachhani, MD, <Dictator> Altamese DillingVAIBHAVKUMAR Eeshan Verbrugge MD ELECTRONICALLY SIGNED 10/03/2012 0:49

## 2014-05-24 NOTE — Op Note (Signed)
PATIENT NAME:  [name], Kristi Harris DATE OF BIRTH:  [birth year]  DATE OF PROCEDURE:  09/20/2012  PREOPERATIVE DIAGNOSIS: Acute cholecystitis.   POSTOPERATIVE DIAGNOSIS: Acute cholecystitis.  PROCEDURE PERFORMED:  Laparoscopic cholecystectomy.     ESTIMATED BLOOD LOSS: 25 mL.   COMPLICATIONS: None.   SPECIMENS: Gallbladder.   ANESTHESIA: General endotracheal.   INDICATION FOR SURGERY:  Ms. [name] is a pleasant 81 year old female with acute onset of right upper quadrant pain who was recently in the hospital. She was thought to have acute cholecystitis and was thus brought to the operating room for cholecystectomy.   DETAILS OF PROCEDURE: Ms. [name] was brought to the operating room suite after informed consent was obtained. She was laid supine on the operating room table. She was induced. Endotracheal tube was placed, general anesthesia was administered. Her abdomen was prepped and draped in standard surgical fashion. A timeout was then performed, correctly identifying the patient name, operative site and procedure to be performed.  A supraumbilical incision was made. It was deepened down to the fascia. The fascia was incised. The peritoneum was entered. Two stay sutures were placed through the fasciotomy. The abdomen was insufflated. The gallbladder was evaluated. It appeared to be distended and erythematous with adhesions. An 11 mm epigastric port was placed as well as two 5 mm subcostal ports. The gallbladder was then lifted over the dome of the liver. The cystic artery and cystic duct was dissected out. The cystic artery was clipped 3 times and ligated. The cystic duct appeared to be large and I was afraid I would not be able to get a good seal with the clips so I put an endoscopic stapler with a blue load.  The gallbladder was then taken off the gallbladder fossa and brought out through a supraumbilical port site with an Endo Catch bag. The gallbladder fossa was evaluated. It  was not hemostatic. Using Bovie electrocautery and later Surgiflo her gallbladder fossa was hemostatic. All trocars were removed. The supraumbilical fascia was closed with a figure-of-eight 0 Vicryl. 4-0 Monocryl deep dermal inverted sutures used to approximate the skin. Steri-Strips, Telfa gauze and Tegaderm were then used to complete the dressing. The patient was awoken, extubated and brought to the postanesthesia care unit.  There were no immediate complications. Needle, sponge, and instrument counts were correct at the end of the procedure.     ____________________________ Si Raiderhristopher A. Arlyce Circle, MD cal:dp D: 09/20/2012 11:11:25 ET T: 09/20/2012 12:08:14 ET JOB#: [id number]  cc: Cristal Deerhristopher A. Irby Fails, MD, <Dictator> Jarvis NewcomerHRISTOPHER A Nikya Busler MD ELECTRONICALLY SIGNED 09/26/2012 14:59

## 2014-05-24 NOTE — H&P (Signed)
PATIENT NAME:  Kristi Harris, Kristi Harris MR#:  [id number] DATE OF BIRTH:  07/03/1924  DATE OF ADMISSION:  09/22/2012  PRIMARY CARE PHYSICIAN: Dr. Florentina JennyHenry Tripp.  EMERGENCY ROOM PHYSICIAN: Dr. Enedina FinnerGoli.   CHIEF COMPLAINT: Altered mental status.   HISTORY OF PRESENT ILLNESS: The patient is an [age] year old female patient [place] because of decreased urine output and altered mental status. The patient recently had gallbladder surgery on August 20. The patient went to [facility]. Staff brought her here because of altered mental status, decreased urine output. The patient had a laparoscopic cholecystectomy done on August 20 this morning. The patient was lethargic more than her baseline. The patient had all the blood work done, including chest x-ray consistent with right lower lobe pneumonia. We were asked to admit the patient. The patient is now very lethargic, unable to give me any history, not oriented to time, place, person. The patient's vitals in the Emergency Room: Temperature 99.2, O2 sats 94% on room air. We are going to admit her for aspiration pneumonia, altered mental status, dehydration.   PAST MEDICAL HISTORY: Significant for admission last year in July and also recently she was seen in the Emergency Room on July 17, [facility] and then had surgery for gallbladder/cholecystectomy on August 20. Past medical history includes hypertension, diabetes,  hyperlipidemia, seizure disorder, chronic kidney disease, anemia of chronic kidney disease, chronic lower extremity ulcers secondary to diabetes, arterial insufficiency. Past medical history also includes bilateral lower extremity peripheral artery disease, hypothyroidism, history of A. fib before.   PAST SURGICAL HISTORY: Significant for knee replacement.   FAMILY HISTORY: Old records reviewed and it shows family history significant for sister with breast cancer and brother had lung cancer.   CODE STATUS: DNR.   SOCIAL HISTORY: No smoking.  No drinking. The patient is a resident [address] She is a widow.   MEDICATIONS: Neurontin 100 mg in the morning and 300 mg at night, Percocet 5/325 mg every 4 to 6 hours as needed for pain, atorvastatin 10 mg daily, Coreg 3.125 mg p.o. b.i.d., ferrous sulfate 325 mg 1 tablet p.o. daily, Januvia 50 mg p.o. daily, levothyroxine 25 mcg p.o. daily, Ativan 0.5 mg p.o. b.i.d. as needed for anxiety, omeprazole 20 mg p.o. daily, Risperdal 1 mg at bedtime for psychosis, tramadol 50 mg p.o. every 4 to 6 hours p.r.n. for pain, trazodone 50 mg p.o. once a day.   ALLERGIES: The patient has no known allergies.   REVIEW OF SYSTEMS: Unobtainable because she is lethargic.   PHYSICAL EXAMINATION:  VITAL SIGNS: Temperature 99.2, heart rate 78, blood pressure 129/61, sats 94% on room air.  GENERAL: The patient is lethargic. Well-developed female, not in distress.  HEENT: Head normocephalic, atraumatic. Eyes: No conjunctival pallor. Pupils are equally reacting to light. Nose: No turbinate hypertrophy. No oropharyngeal erythema.  NECK: Supple. No JVD. No carotid bruit. The patient has no lymphadenopathy.  RESPIRATORY: The patient has decreased breath sounds at the right base. Other than that, rest of the lungs are clear. No wheezing. No rales.  CARDIOVASCULAR: S1, S2 regular. No murmurs. The patient does have chronic lower extremity cellulitis. Pulses present in femoral, dorsalis pedis.  GASTROINTESTINAL: The patient has right upper quadrant tenderness present. Bowel sounds present. No organomegaly.  SKIN: Does have chronic lower extremity venous stasis changes present. No obvious infections.  NEUROLOGICAL: The patient is lethargic. Unable to perform full neurological exam, but has no facial droop and no flaccidity.  PSYCHIATRIC: The patient is disoriented  to time, place, person.   LABORATORY, DIAGNOSTIC AND RADIOLOGICAL DATA: Urinalysis is amber-colored urine with no bacteria. Lipase 66. TSH 0.811. Troponin less than  0.22. Electrolytes: Sodium 139, potassium 3.8, chloride 104, bicarbonate 31, BUN 34, creatinine 0.96, glucose 172. LFTs within normal limits. WBC up at 11.6, hemoglobin 12.4, hematocrit 37.6, platelets 109. Chest x-ray consistent with right lower lobe pneumonia. EKG: Sinus rhythm with some PVCs, 80 beats per minute, no ST-T changes.   ASSESSMENT AND PLAN: An 81 year old female with:  1.  Altered mental status, metabolic encephalopathy secondary to pneumonia, dehydration. The patient is going to be admitted to hospitalist service, off-unit telemetry. Start her on vancomycin and Zosyn to cover for methicillin-resistant Staphylococcus aureus due to recent surgery. She will be "nothing by mouth." Will get speech therapy evaluation. Start her on intravenous fluids with normal saline at 80 mL an hour. Monitor urine output. Continue oxygen 2 liters via nasal cannula. Code status is DO NOT RESUSCITATE. Follow the blood cultures.  2.  Altered mental status, likely secondary to p.o. dehydration and also medication induced. She is on Norco for pain control at nursing home, and she is also on Ativan. Will hold these medications and continue hydration, antibiotics, and see how she does. 3.  Dysphagia. Registered nurse mentioned that she is choking on water, so will keep her "nothing by mouth." Continue fluids, intravenous antibiotics. Hold all the oral medications including Amaryl, Januvia, levothyroxine, gabapentin, aspirin, ferrous sulfate, tramadol, Ativan and Risperdal at this time.  4.  The patient had history of atrial fibrillation, now in sinus. She is on Coreg. Rate is controlled at this time, so we will monitor on telemetry. If needed, will give intravenous Lopressor.  5.  All the medications will be resumed once she is seen by speech therapy.  6.  Code status: DO NOT RESUSCITATE.  7.  Right upper quadrant pain, likely secondary to recent surgery. The patient's LFTs are within normal limits, and she also had a  right upper quadrant sonogram done in the Emergency Room, but the results are pending.   TIME SPENT: About 55 minutes on history and physical.    ____________________________ Katha Hamming, MD sk:jm D: 09/22/2012 11:41:48 ET T: 09/22/2012 12:47:05 ET JOB#: [id number]  cc: Katha Hamming, MD, <Dictator> Drue Dun. Redmond School, MD Katha Hamming MD ELECTRONICALLY SIGNED 10/24/2012 18:22

## 2014-05-24 NOTE — Consult Note (Signed)
PATIENT NAME:  Kristi Harris, Kristi Harris MR#:  [id number] DATE OF BIRTH:  05-05-1924  DATE OF CONSULTATION:  08/17/2012  REFERRING PHYSICIAN:   CONSULTING PHYSICIAN:  Quentin Orealph Harris. Ely III, MD.   PRIMARY CARE PHYSICIAN: Dr. Redmond Schoolripp.   BRIEF HISTORY: Kristi Harris is an 81 year old woman seen in the Emergency Room with right upper quadrant, right flank, right rib, abdominal pain. She has had difficulty with pain for approximately 36 hours. She had a similar episode 4 to 6 weeks ago. She lives in an assisted living facility and was told at that time she likely had cough with a right rib fracture. X-rays were unremarkable at that time. She was evaluated in the Emergency Room. Laboratory values were largely unremarkable, and she did not have any further workup. This episode associated with some mild nausea and anorexia. She has had low-grade fever. She has not had any vomiting. She does not have any bloating or any change in her bowel habits. Workup in the Emergency Room again revealed no significant evidence of laboratory abnormalities with the exception of a slightly decreased platelet count of 139,000. Gallbladder ultrasound was performed which demonstrated gallbladder wall thickening, with apparent sludge. No sign of any gallstones. The surgical service was consulted for possible acalculous cholecystitis.   This woman has multiple medical problems. She denies any other GI problems. No history of hepatitis, yellow jaundice, pancreatitis, peptic ulcer disease, previous diagnosis of gallbladder disease or diverticulitis. She does have hypertension, diabetes, hyperlipidemia, chronic kidney disease, significant lower extremity vascular disease which prompted her last admission a year ago for arteriography and further intervention, with eventual healing of her lower extremity wounds. Her only previous surgery has been knee replacement.   She is not a cigarette smoker and has no previous alcohol history. She is a widowed  woman living at an assisted living facility at [facility].   REVIEW OF SYSTEMS: Otherwise unremarkable.   MEDICATIONS: Include Vicodin 5/325 every 6 hours p.r.n. pain, aspirin 81 mg daily, carvedilol 3.125 mg twice a day, Claritin-D once a day, ferrous sulfate 325 once a day, gabapentin 100 mg in the morning and 300 mg at night, glimepiride 2 mg once a day, Januvia 100 mg once a day, levothyroxine 0.025 mg once a day, Lipitor 10 mg once a day, lorazepam 0.5 mg b.i.d., omeprazole 20 mg once a day, risperidone 1 mg once a day and Tylenol 2 tablets every 6 hours p.r.n.   ALLERGIES: She has no medical allergies.   PHYSICAL EXAMINATION:  GENERAL: She is an alert, comfortable, very cooperative woman who appears appropriate and informed about her medical condition.  VITALS: Blood pressure is 103/64. Heart rate is 92 and regular. She is afebrile.  HEENT: No scleral icterus. There is a small laceration on her forehead. She has no pupillary abnormalities.  NECK: Supple, nontender, with no adenopathy and normal midline trachea.  CHEST: Clear, although taking a deep breath does increase her right upper quadrant, right flank pain. She has normal pulmonary excursion.  CARDIAC: No murmurs or gallops. She appears to be in normal sinus rhythm, although she does carry the diagnosis of possible intermittent atrial fibrillation.  ABDOMEN: No significant abdominal findings. She does have some very mild right upper quadrant tenderness. She does have a positive Murphy sign with deep inspiration. She has active bowel sounds and no rebound or guarding.  EXTREMITIES: No distal pulses. No edema. Full range of motion.  PSYCHIATRIC: Normal orientation. Normal affect.   In this situation, I think she likely  does have acute acalculous cholecystitis, although she does not appear to be significantly ill. I have talked to the family about the options of admission versus followup as an outpatient on antibiotic therapy. The patient  really does not want to have surgery, does not want to be admitted at this time. After discussing the situation with the daughter and the patient, we will put her on Keflex 500 mg p.o. t.i.d. and Vicodin and see her back in the office as necessary to consider a HIDA scan. They are in agreement with this plan but have agreed also that if her symptoms worsen, they will return to the Emergency Room for further evaluation and calling our surgical service and we may admit her and plan surgical intervention.   ____________________________ Quentin Ore III, MD rle:gb D: 08/17/2012 02:29:46 ET T: 08/17/2012 03:05:31 ET JOB#: [id number]  cc: Carmie End, MD, <Dictator> Drue Dun. Redmond [employer], MD Quentin Ore MD ELECTRONICALLY SIGNED 08/17/2012 19:38

## 2014-05-26 NOTE — H&P (Signed)
PATIENT NAME:  Kristi Harris, Kristi Harris MR#:  [id number] DATE OF BIRTH:  1924-02-27  DATE OF ADMISSION:  08/04/2011  PRIMARY CARE PHYSICIAN: Dr. Florentina Jenny    CHIEF COMPLAINT: Bilateral feet pain and unhealing ulcers.   HISTORY OF PRESENT ILLNESS: Kristi Harris is an 81 year old pleasant Caucasian female, nursing home resident, who was sent for evaluation and treatment of nonhealing bilateral feet ulcers now associated with cellulitis. This is after two months of outpatient treatment.   REVIEW OF SYSTEMS: CONSTITUTIONAL: Denies any fever. No chills. No fatigue. EYES: No blurring of vision. No double vision. ENT: No hearing impairment. No sore throat. No dysphagia. CARDIOVASCULAR: No chest pain. No shortness of breath. No edema. RESPIRATORY: No cough. No chest pain. No shortness of breath. GASTROINTESTINAL: No abdominal pain. No vomiting. No diarrhea. GENITOURINARY: No dysuria. No frequency of urination. MUSCULOSKELETAL: No joint swelling. No joint pain other than her feet pain. No muscular pain or swelling. INTEGUMENTARY: No skin rash but she has ulcers in both feet, most of her toes on the dorsal surface. NEUROLOGIC: No focal weakness. No seizure activity. No headache. She has past history of seizure activity. PSYCHIATRY: She has anxiety but no depression. ENDOCRINE: No polyuria or polydipsia. No heat or cold intolerance.   PAST MEDICAL HISTORY:  1. Systemic hypertension.  2. Diabetes mellitus. 3. Hyperlipidemia. 4. Seizure activity. 5. Chronic kidney disease, stage III.   PAST SURGICAL HISTORY: Knee replacement.   FAMILY HISTORY: She has a sister who had breast cancer and a brother who had lung cancer. Both are expired.   SOCIAL HABITS: Nonsmoker. No history of alcohol abuse.   SOCIAL HISTORY: She is widowed. Lives at nursing home.   ADMISSION MEDICATIONS:  1. Cymbalta 60 mg once a day. 2. Alprazolam or Xanax 0.25 mg q.6 hours p.r.n. for anxiety and 0.5 mg at bedtime.  3. Divalproex 250 mg  twice a day. 4. Divalproex 500 mg twice a day.  5. Ferrous sulfate 325 mg a day. 6. Gabapentin 100 mg twice a day. 7. Glipizide 10 mg twice a day.  8. Hydrochlorothiazide with lisinopril 12.5/10 once a day. 9. Hydrocodone with Tylenol p.r.n. for pain. 10. Ibuprofen 400 mg q.6 hours p.r.n.  11. Loratadine or Claritin 10 mg a day.  12. Multivitamin once a day. 13. Omeprazole 20 mg twice a day.  14. Risperidone 0.25 mg at bedtime. 15. Simvastatin 20 mg at bedtime.  16. Trazodone 50 mg at bedtime.   ALLERGIES: No known drug allergies.   PHYSICAL EXAMINATION:   VITAL SIGNS: Blood pressure 183/74, respiratory rate 18, pulse 85, temperature 97.9, oxygen saturation 98%.   GENERAL APPEARANCE: Elderly pleasant lady laying in bed in no acute distress.   HEAD: No pallor. No icterus. No cyanosis.   EARS, NOSE, AND THROAT: Hearing was normal. Nasal mucosa, lips, tongue were normal.   EYES: Small pupils a few mm, could not see reaction to light.   NECK: Supple. Trachea at midline. No thyromegaly. No cervical lymphadenopathy. No masses.   HEART: Normal S1, S2. No S3, S4. No murmur. No gallop. No carotid bruits.   RESPIRATORY: Normal breathing pattern without use of accessory muscles. No rales. No wheezing.   ABDOMEN: Soft without tenderness. No hepatosplenomegaly. No masses. No hernias.   SKIN: Ulcers on the dorsal aspect of most of her toes, some of them with necrotic tissue. There is also surrounding redness consistent with cellulitis.   MUSCULOSKELETAL: No joint swelling. No clubbing.   NEUROLOGIC: Cranial nerves II through XII are intact.  No focal motor deficit.   PSYCHIATRIC: The patient is alert and oriented to place. She knows this is a hospital but did not know exact name. She knows the month and the day of the week but she thought it is [id number], probably she meant 2013.   LABORATORY, DIAGNOSTIC, AND RADIOLOGICAL DATA: EKG showed normal sinus rhythm at rate of 78 per minute.  Nonspecific ST abnormalities in the lateral leads.   Serum glucose 203, BUN 40, creatinine 1.5, sodium 143, potassium 4.7, estimated GFR 30. Normal liver function tests except for slightly low albumin at 2.9. CBC showed white count of 7400, hemoglobin 11, hematocrit 34, platelet count 179.   ASSESSMENT:  1. Diabetic foot ulcers complicated by cellulitis, possible vascular insufficiency to be considered.  2. Diabetes mellitus, type II.  3. Systemic hypertension.  4. Chronic kidney disease, stage III.  5. Hyperlipidemia.  6. History of seizures.   PLAN:  1. Admit to medical floor.  2. Start IV antibiotic using Rocephin.  3. Wet and dry dressing temporarily until evaluation by the Wound Care team.  4. Continue the home medications as listed above.  5. Accu-Cheks and sliding scale for better control of blood sugar.   I would like to mention that the patient's CODE STATUS is DO NOT RESUSCITATE. The patient does indicate that she has a LIVING WILL and she gave the power-of-attorney for medical decisions to her daughter. Her name is Engineer, manufacturing systemsHattie.   Time Spent evaluating this patient and reviewing her records and nursing home data: More than 55 minutes.   ____________________________ Carney CornersAmir M. Rudene Rearwish, MD amd:drc D: 08/04/2011 00:46:17 ET T: 08/04/2011 07:32:37 ET JOB#: [id number]  cc: Carney CornersAmir M. Rudene Rearwish, MD, <Dictator> Dr. Florentina JennyHenry Tripp  Carney CornersAMIR M Mardell Cragg MD ELECTRONICALLY SIGNED 08/07/2011 22:14

## 2014-05-26 NOTE — Consult Note (Signed)
Impression: 81yo WF w/ h/o DM and CR9I admitted with diabetic foot ulcers.  There are some areas of blackened eschars on some of the toes.  She does not have any significant erythema surrounding the lesions.  There is no purulent drainage.  She is afebrile with a normal WBC.  I do not see signs of active infection. Her culture is growing GNRs.  This likely represents colonization.  She is to undergo vascular evaluation with intervention if needed, followed by more aggressive wound care by the [facility]. Would consider topical antiseptic therapy such as silver to decrease the colonization.  No need for systemic antibiotics. Will sign off.  Please call with questions.   Electronic Signatures: Armari Fussell, Rosalyn GessMichael E (MD)  (Signed on 04-Jul-13 09:59)  Authored  Last Updated: 04-Jul-13 09:59 by Javonte Elenes, Rosalyn GessMichael E (MD)

## 2014-05-26 NOTE — Discharge Summary (Signed)
PATIENT NAME:  Kristi Harris, Kristi Harris MR#:  [id number] DATE OF BIRTH:  1924/12/24  DATE OF ADMISSION:  08/04/2011 DATE OF DISCHARGE:  08/06/2011  DIAGNOSES: 1. Chronic lower extremity ulcers, likely due to a combination of diabetes, arterial insufficiency, and poor foot wear. 2. Anemia of chronic disease.  3. Diabetes.  4. Hypertension.  5. Chronic kidney disease.  6. Hyperlipidemia.  7. Seizure disorder.   DISPOSITION: The patient is being discharged back to her assisted living facility.   DIET: Low sodium, 1800 calorie ADA diet.   ACTIVITY: As tolerated.  DISCHARGE INSTRUCTIONS: Resume home health R.N. Please send wound measurements with the patient. Follow up with the [facility]. Outpatient angiography on 08/09/2011 Dr. Driscilla Grammesew's office will contact the patient.  DISCHARGE MEDICATIONS:  1. Norvasc 2.5 mg daily, hold for systolic blood pressure less than 90.  2. Hydrocodone 1 capsule every six hours p.r.n.  3. Xanax 0.25 mg every six hours p.r.n.  4. Trazodone 50 mg at bedtime. 5. Simvastatin 20 mg daily. 6. Xanax 0.5 mg at bedtime. 7. Risperdal 0.25 mg at bedtime.  8. Multivitamin 1 tablet daily. 9. Cymbalta 60 mg daily.  10. Ferrous sulfate 325 mg daily. 11. Omeprazole 20 mg b.i.d. 12. Glipizide 10 mg b.i.d. 13. Neurontin 100 mg b.i.d. 14. Depakote total of 750 mg b.i.d. 15. Loratadine 10 mg daily. 16. PreserVision 1 capsule once a day. 17. Hydrochlorothiazide/lisinopril 12.5 mg/10 mg daily.   RESULTS: Wound culture grew Pseudomonas and two unidentified bacteria. White count normal. Hemoglobin 11.3, hematocrit 34.3, platelet count 179,000. Glucose 203. BUN 1.58 on admission, normal by the time of discharge. The rest of complete metabolic panel normal.   HOSPITAL COURSE: The patient is an 81 year old female with past medical history of diabetes, hypertension who presented with chronic nonhealing ulcers on her lower extremities. A wound care evaluation was obtained, and the  wound care RN felt that the patient had a combination of diabetes, arterial insufficiency and poor foot wear. The patient was not systemically ill from her lower extremity ulcers. Her white count was normal. She was afebrile. Her wound culture was sent and grew pseudomonas, then two types of unidentified bacterial microorganisms.  An ID consult with Dr. Leavy CellaBlocker was obtained who felt that the patient's ulcers were not infected. He did not recommend any oral or systemic antibiotics. He felt that the growth on the wound culture was likely colonization and if oral antibiotics are given to reduce the bacterial load , the current bacteria are going to be just replaced with other more resistant bacteria.  A vascular surgery consultation with Dr. Wyn Quakerew was obtained. Initially the patient refused to have an angiography but subsequently agreed. She has been tentatively scheduled for angiography on Monday 08/09/2011. Dr. Driscilla Grammesew's office will contact the patient regarding this.  The rest of her medical problems remained stable. She is being discharged in a stable condition.   TIME SPENT: 45 minutes.     ____________________________ Darrick MeigsSangeeta Chandria Rookstool, MD sp:kma D: 08/06/2011 13:19:30 ET/T: 08/07/2011 15:28:43 ET/JOB#: [id number] cc: Darrick MeigsSangeeta Raetta Agostinelli, MD, <Dictator> Darrick MeigsSANGEETA Clovis Mankins MD ELECTRONICALLY SIGNED 08/14/2011 12:01

## 2014-05-26 NOTE — Op Note (Signed)
PATIENT NAME:  Kristi Harris, Kristi Harris MR#:  [id number] DATE OF BIRTH:  24-Oct-1924  DATE OF PROCEDURE:  08/11/2011  PREOPERATIVE DIAGNOSES:  1. Peripheral arterial disease with ulceration, bilateral lower extremities.  2. Dementia.  3. Hyperlipidemia.  4. Diabetes.  5. Hypertension.   POSTOPERATIVE DIAGNOSES:  1. Peripheral arterial disease with ulceration, bilateral lower extremities.  2. Dementia.  3. Hyperlipidemia.  4. Diabetes.  5. Hypertension.   PROCEDURES:  1. Ultrasound guidance for vascular access to left femoral artery.  2. Catheter placement into right peroneal artery from left femoral approach.  3. Aortogram and selective right lower extremity angiogram.  4. Percutaneous transluminal angioplasty of tibioperoneal trunk and peroneal artery with 3 mm diameter angioplasty balloon.  5. Percutaneous transluminal angioplasty of popliteal artery and distal superficial femoral artery with 5 mm diameter angioplasty balloon.  6. Self-expanding stent placement to distal SFA and above-knee popliteal artery for greater than 50% residual stenosis after angioplasty with a 6 mm diameter self-expanding stent.  7. StarClose closure device, left femoral artery.   SURGEON: Annice Needy, MD   ANESTHESIA: Local with moderate conscious sedation.   ESTIMATED BLOOD LOSS: 25 mL.   INDICATION FOR PROCEDURE: An 81 year old white female with nonhealing ulcerations of both lower extremities. The right is the worse of the two with a recent admission for cellulitis. She is brought in today for angiography with possible revascularization. The risks and benefits were discussed. Informed consent was obtained.   DESCRIPTION OF PROCEDURE: The patient was brought to the Vascular Interventional Radiology Suite. Groins were shaved and prepped and a sterile surgical field was created. The left femoral head was localized with fluoroscopy, and the left femoral artery was accessed under direct ultrasound guidance due  to poor body habitus without difficulty. A J-wire and 5-French sheath were then placed. A pigtail catheter was placed in the aorta at the L1 level, and AP aortogram was performed. This showed normal flow through the renal vessels with no flow-limiting stenosis in the aortoiliac segments. I then hooked the aortic bifurcation and advanced to the right femoral head, and selective right lower extremity angiogram was then performed. This demonstrated normal common femoral artery, profunda femoris artery. The superficial femoral artery was patent proximally but in the mid to distal segments had a significant amount of calcific disease, and a high-grade stenosis was seen at Hunter's canal. The popliteal was somewhat diffusely diseased with a couple areas of 70 to 80% stenosis throughout its course. The tibioperoneal trunk also had some moderate stenosis in the 70% range, and then there was a subtotal occlusion of the peroneal artery approximately 8 to 10 cm beyond its origin. This was a short segment occlusion and this was the best runoff distally. The patient was given 3500 units of intravenous heparin for systemic anticoagulation, and I used a Terumo Advantage Wire and a Kumpe catheter to cross these lesions without difficulty and parked the wire into the distal peroneal artery. A 3 mm diameter angioplasty balloon was inflated from the mid peroneal artery encompassing the mid peroneal subtotal occlusion, and a second inflation was performed at the proximal peroneal artery and tibioperoneal trunk. Waists were taken which resolved. I then used a 5 mm diameter angioplasty balloon throughout the popliteal artery and the distal superficial femoral artery. Several areas of waists were seen along the way with angioplasty and resolved with angioplasty. Completion angiogram showed an area at [facility] canal with a greater than 50% residual stenosis after angioplasty. This was treated with a 6  mm diameter self-expanding stent  ironed out with a 5 mm balloon. Completion angiogram following stent placement showed the SFA and popliteal artery to be widely patent. The tibioperoneal trunk and peroneal artery had good flow distally until the peroneal artery terminated just above the ankle. The foot was filled mostly through collaterals.  I then pulled the sheath back to the ipsilateral external iliac artery and oblique arteriogram was performed. A StarClose closure device was deployed in the usual fashion with excellent hemostatic result. The patient tolerated the procedure well and was taken to the recovery room in stable condition.  ____________________________ Annice NeedyJason S. Dew, MD jsd:cbb D: 08/11/2011 16:14:26 ET T: 08/11/2011 16:49:59 ET JOB#: [id number] cc: Drue DunHenry F. Redmond Schoolripp, MD Annice NeedyJASON S DEW MD ELECTRONICALLY SIGNED 08/12/2011 13:47
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
## 2014-05-26 NOTE — Consult Note (Signed)
Brief Consult Note: Diagnosis: Atherosclerosis of the lower extremities with ulcers.   Patient was seen by consultant.   Recommend to proceed with surgery or procedure.   Comments: Patient should have an angiogram with the intention for intervention.  At this time she is not yet will to consent to the procedure.  We will follow  to see if she will allow the angio/intervention.  Electronic Signatures: Levora DredgeSchnier, Eros Montour (MD)  (Signed 03-Jul-13 20:37)  Authored: Brief Consult Note   Last Updated: 03-Jul-13 20:37 by Levora DredgeSchnier, Cristan Scherzer (MD)

## 2015-03-23 IMAGING — CR DG CHEST 1V PORT
1 series · 1 of 1 positions shown · non-contrast
Comparison: none

REASON FOR EXAM: AMS FEVER
COMMENTS:

PROCEDURE:     DXR - DXR PORTABLE CHEST SINGLE VIEW  - September 22, 2012 [DATE]
RESULT:     Comparison is made to prior study dated 08/16/2012.

[ap]
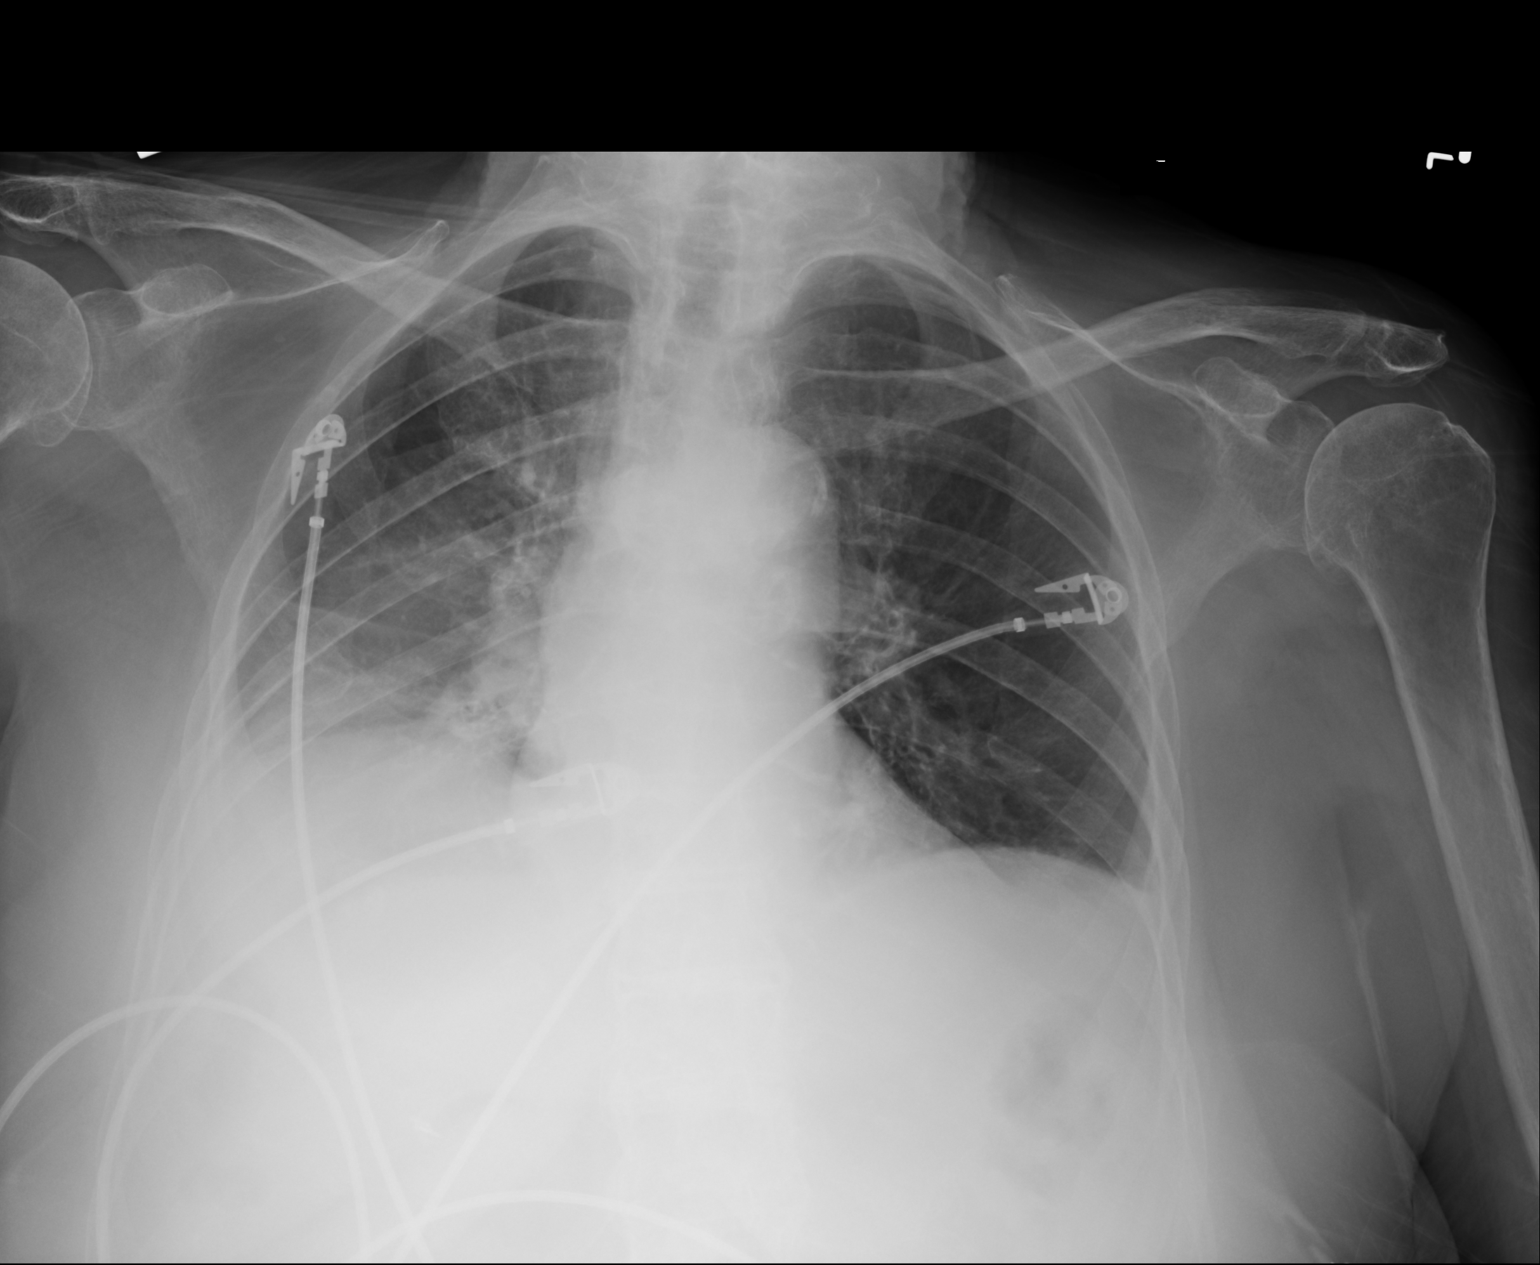

[1 of 1 positions shown; findings below may reference images not displayed]

FINDINGS: An area of increased density projects within the right lung base.
There is volume loss within the right hemithorax and diffuse increased
density within the right hemithorax. The cardiac silhouette is within normal
limits. The visualized bony skeleton is unremarkable.
IMPRESSION: 1. Atelectasis versus infiltrate versus possible effusion right lung base.
2. Atelectasis versus infiltrate diffusely within the aerated portions of
the right lung. Surveillance evaluation recommended with deeper inspiration.

## 2015-03-24 IMAGING — CT CT HEAD WITHOUT CONTRAST
2 series · 15 of 30 positions shown, 19 images · non-contrast
Comparison: none

REASON FOR EXAM: altered mental status
COMMENTS:

[Series 4: soft tissue 2 · axial · 0.43mm/px · z∈[+1134,+1295]mm · 13 of 39 slices shown, 17 images]
[im 3/39  brain]
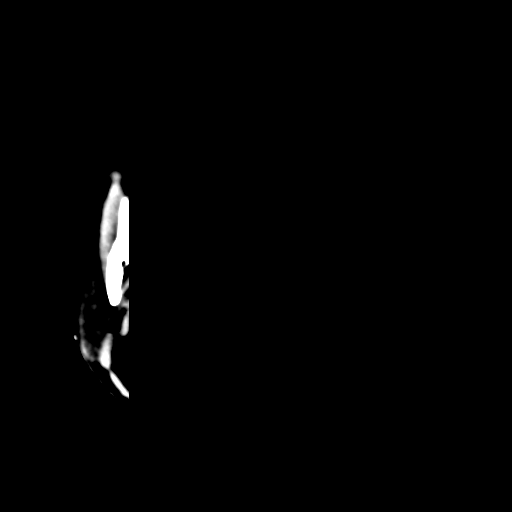
[im 3/39  bone]
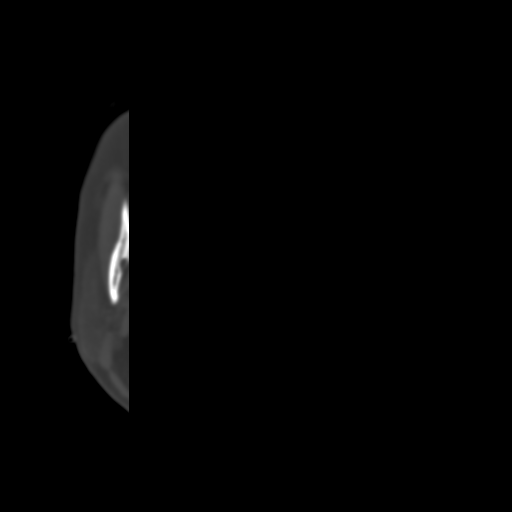
[im 6/39  brain]
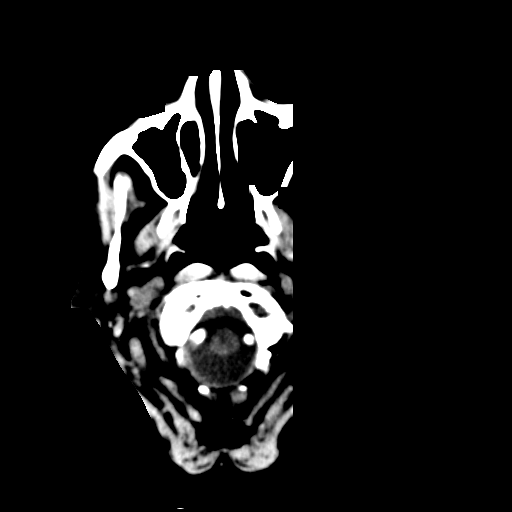
[im 9/39  brain]
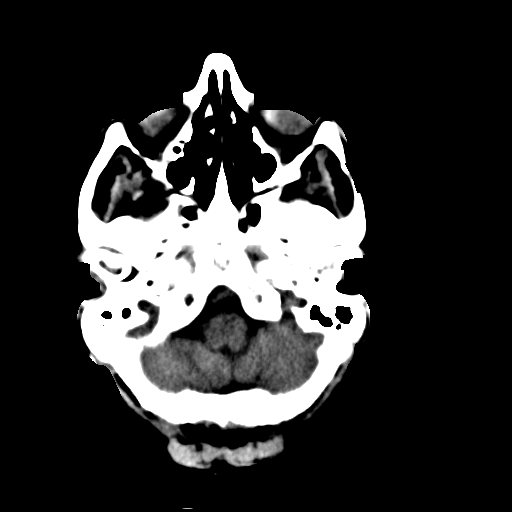
[im 11/39  brain]
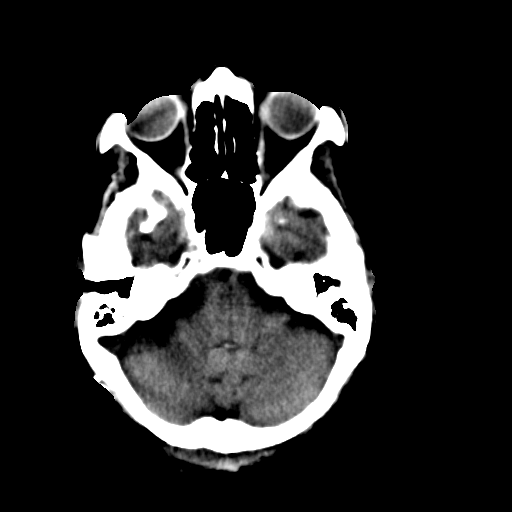
[im 14/39  brain]
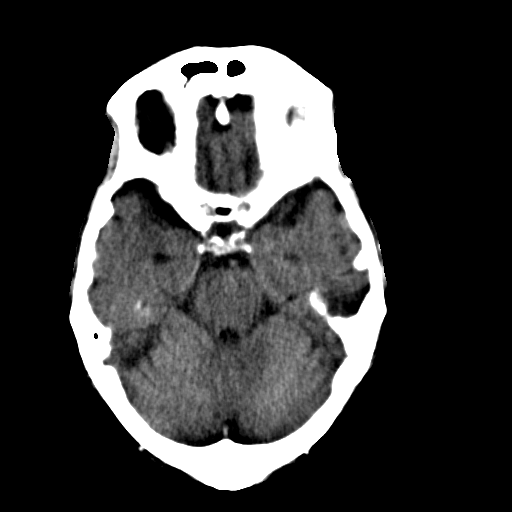
[im 14/39  bone]
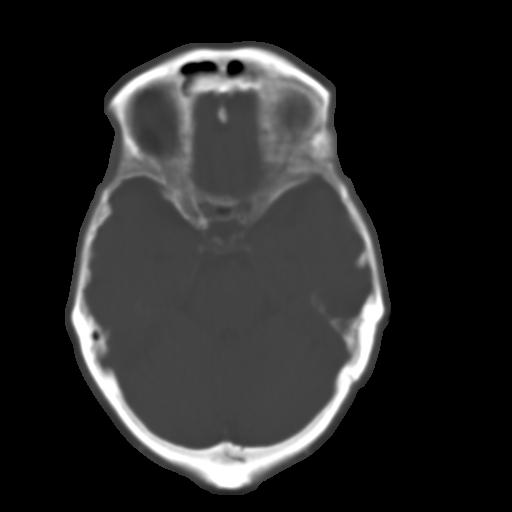
[im 17/39  brain]
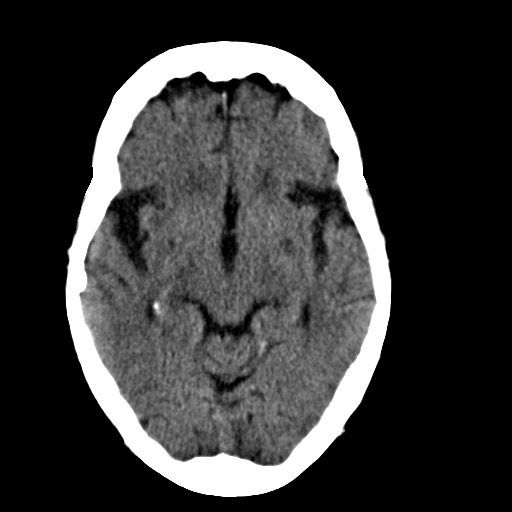
[im 20/39  brain]
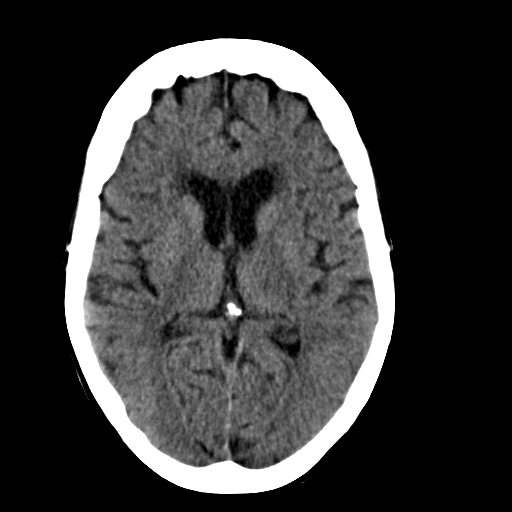
[im 22/39  brain]
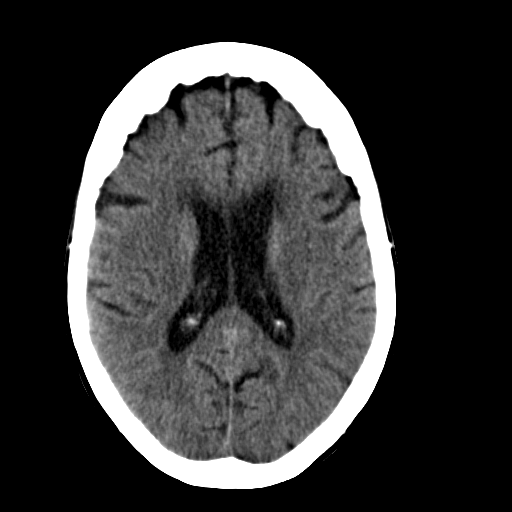
[im 25/39  brain]
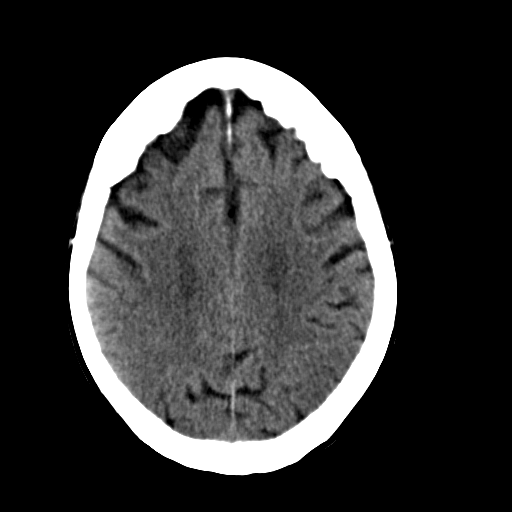
[im 25/39  bone]
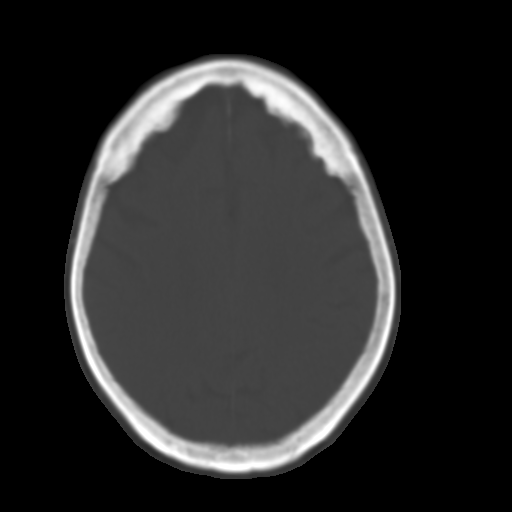
[im 28/39  brain]
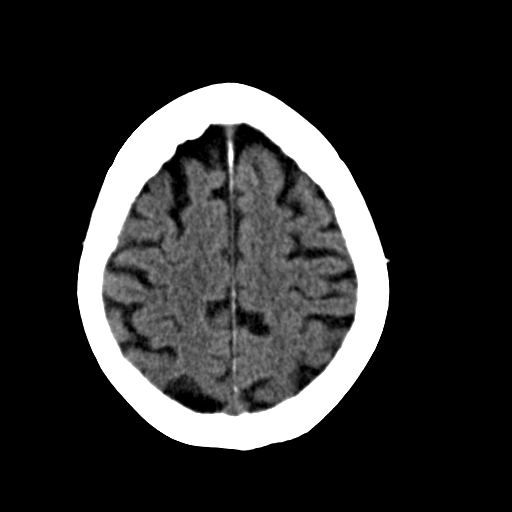
[im 30/39  brain]
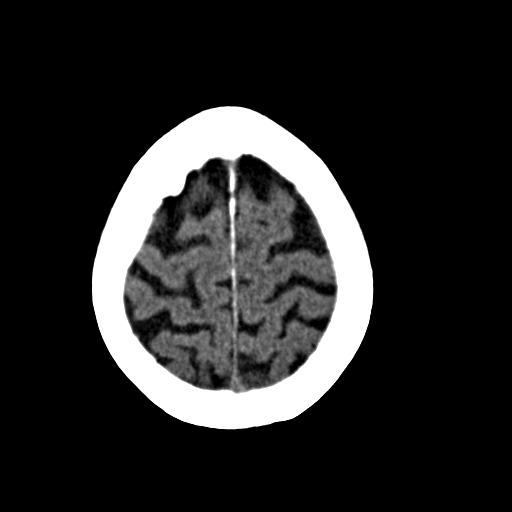
[im 33/39  brain]
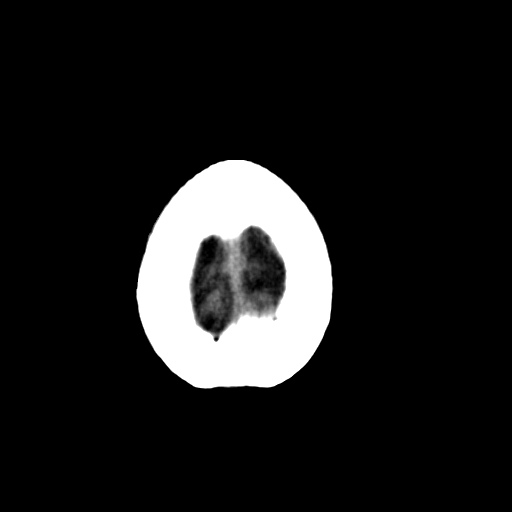
[im 36/39  brain]
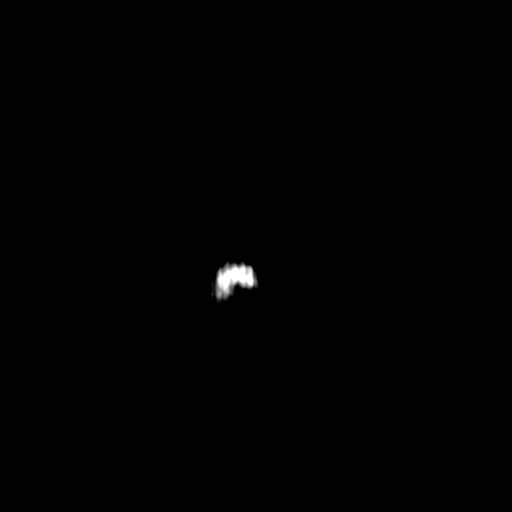
[im 36/39  bone]
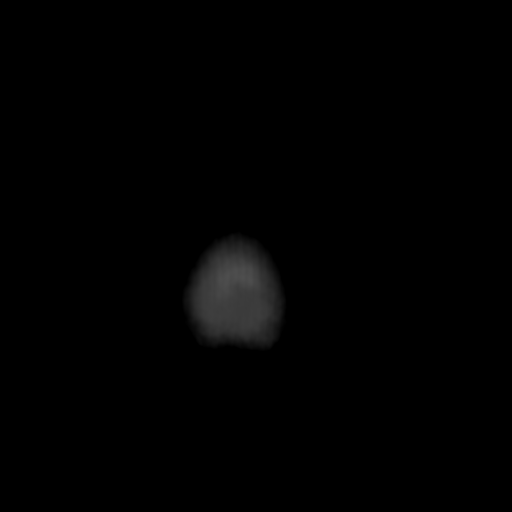

[Series 5: bone 2 · axial · 0.41mm/px · z∈[+1135,+1164]mm · 2 of 39 slices shown]
[im 3/39  bone]
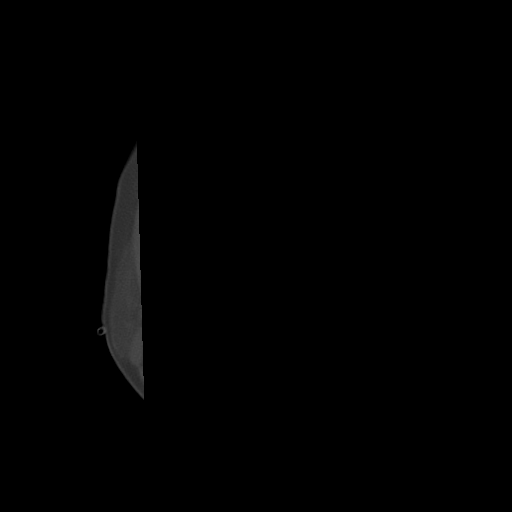
[im 9/39  bone]
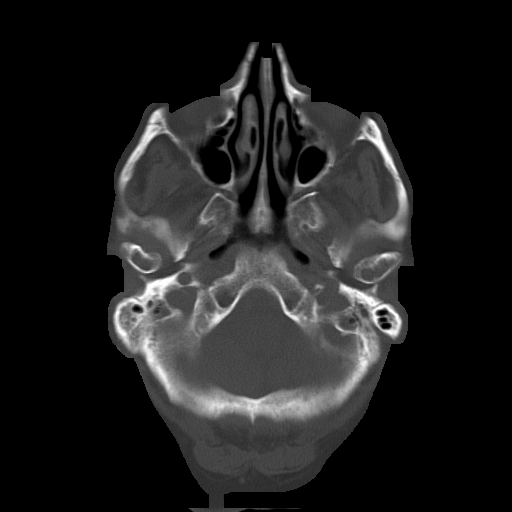

[15 of 30 positions shown; findings below may reference images not displayed]

PROCEDURE:     CT  - CT HEAD WITHOUT CONTRAST  - September 23, 2012 [DATE]

RESULT:     Axial noncontrast CT scanning was performed through the brain
with reconstructions at 5 mm intervals and slice thicknesses. Comparison is
made to the study July 20, 2012.

There is mild age-appropriate diffuse cerebral and cerebellar atrophy with
compensatory ventriculomegaly. There is a punctate basal ganglia
calcification on the left which is stable. There are stable hypodensities in
the basal ganglia bilaterally consistent with previous lacunar infarctions.
There is no evidence of an acute ischemic or hemorrhagic infarction nor
evidence of other acute intracranial hemorrhage. The cerebellum and
brainstem are normal in density. At bone window settings the observed
portions of the paranasal sinuses and mastoid air cells are clear.
IMPRESSION: 1. There is no evidence of an acute ischemic or hemorrhagic infarction.
2. There is no intracranial mass effect nor hydrocephalus.
3. There are chronic age-related changes present which appears stable.

[REDACTED]
